# Patient Record
Sex: Male | Born: 1990
Health system: Southern US, Community
[De-identification: ages and names within clinical notes are randomized; demographics above are authoritative.]

## PROBLEM LIST (undated history)

## (undated) DIAGNOSIS — I10 Essential (primary) hypertension: Secondary | ICD-10-CM

## (undated) DIAGNOSIS — E66812 Obesity, class 2: Secondary | ICD-10-CM

## (undated) DIAGNOSIS — F419 Anxiety disorder, unspecified: Secondary | ICD-10-CM

## (undated) DIAGNOSIS — E669 Obesity, unspecified: Secondary | ICD-10-CM

## (undated) DIAGNOSIS — D751 Secondary polycythemia: Secondary | ICD-10-CM

## (undated) DIAGNOSIS — M7918 Myalgia, other site: Secondary | ICD-10-CM

## (undated) HISTORY — DX: Obesity, unspecified: E66.9

## (undated) HISTORY — DX: Secondary polycythemia: D75.1

## (undated) HISTORY — DX: Myalgia, other site: M79.18

## (undated) HISTORY — DX: Anxiety disorder, unspecified: F41.9

## (undated) HISTORY — DX: Obesity, class 2: E66.812

---
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
## 2008-06-13 ENCOUNTER — Emergency Department (HOSPITAL_COMMUNITY): Admission: EM | Admit: 2008-06-13 | Discharge: 2008-06-13 | Payer: Self-pay | Admitting: Emergency Medicine

## 2008-06-21 ENCOUNTER — Emergency Department (HOSPITAL_COMMUNITY): Admission: EM | Admit: 2008-06-21 | Discharge: 2008-06-21 | Payer: Self-pay | Admitting: Emergency Medicine

## 2008-09-23 ENCOUNTER — Emergency Department (HOSPITAL_COMMUNITY): Admission: EM | Admit: 2008-09-23 | Discharge: 2008-09-23 | Payer: Self-pay | Admitting: Emergency Medicine

## 2009-04-23 ENCOUNTER — Emergency Department (HOSPITAL_COMMUNITY): Admission: EM | Admit: 2009-04-23 | Discharge: 2009-04-23 | Payer: Self-pay | Admitting: Emergency Medicine

## 2009-05-03 ENCOUNTER — Emergency Department (HOSPITAL_COMMUNITY): Admission: EM | Admit: 2009-05-03 | Discharge: 2009-05-03 | Payer: Self-pay | Admitting: Emergency Medicine

## 2010-06-08 LAB — DIFFERENTIAL
Basophils Absolute: 0 10*3/uL (ref 0.0–0.1)
Eosinophils Absolute: 0.4 10*3/uL (ref 0.0–0.7)
Lymphs Abs: 1.8 10*3/uL (ref 0.7–4.0)
Monocytes Absolute: 0.7 10*3/uL (ref 0.1–1.0)
Monocytes Relative: 10 % (ref 3–12)
Neutrophils Relative %: 56 % (ref 43–77)

## 2010-06-08 LAB — CBC
HCT: 47 % (ref 39.0–52.0)
Hemoglobin: 15.9 g/dL (ref 13.0–17.0)
MCHC: 33.8 g/dL (ref 30.0–36.0)
MCV: 87.3 fL (ref 78.0–100.0)
RBC: 5.38 MIL/uL (ref 4.22–5.81)

## 2010-06-08 LAB — COMPREHENSIVE METABOLIC PANEL
AST: 22 U/L (ref 0–37)
BUN: 13 mg/dL (ref 6–23)
Calcium: 9.1 mg/dL (ref 8.4–10.5)
Chloride: 100 mEq/L (ref 96–112)
Creatinine, Ser: 0.73 mg/dL (ref 0.4–1.5)
Potassium: 3.9 mEq/L (ref 3.5–5.1)
Total Bilirubin: 0.7 mg/dL (ref 0.3–1.2)

## 2010-09-06 ENCOUNTER — Emergency Department (HOSPITAL_COMMUNITY): Payer: Self-pay

## 2010-09-06 ENCOUNTER — Emergency Department (HOSPITAL_COMMUNITY)
Admission: EM | Admit: 2010-09-06 | Discharge: 2010-09-06 | Disposition: A | Discharge status: Home / Self Care | Payer: Self-pay | Attending: Emergency Medicine | Admitting: Emergency Medicine

## 2010-09-06 DIAGNOSIS — J069 Acute upper respiratory infection, unspecified: Secondary | ICD-10-CM | POA: Insufficient documentation

## 2010-09-06 DIAGNOSIS — R197 Diarrhea, unspecified: Secondary | ICD-10-CM | POA: Insufficient documentation

## 2010-09-06 DIAGNOSIS — J3489 Other specified disorders of nose and nasal sinuses: Secondary | ICD-10-CM | POA: Insufficient documentation

## 2010-11-28 ENCOUNTER — Emergency Department (HOSPITAL_COMMUNITY)
Admission: EM | Admit: 2010-11-28 | Discharge: 2010-11-28 | Disposition: A | Discharge status: Home / Self Care | Payer: Self-pay | Attending: Emergency Medicine | Admitting: Emergency Medicine

## 2010-11-28 ENCOUNTER — Encounter: Payer: Self-pay | Admitting: *Deleted

## 2010-11-28 DIAGNOSIS — T7840XA Allergy, unspecified, initial encounter: Secondary | ICD-10-CM

## 2010-11-28 DIAGNOSIS — R0602 Shortness of breath: Secondary | ICD-10-CM | POA: Insufficient documentation

## 2010-11-28 DIAGNOSIS — R21 Rash and other nonspecific skin eruption: Secondary | ICD-10-CM | POA: Insufficient documentation

## 2010-11-28 HISTORY — DX: Essential (primary) hypertension: I10

## 2010-11-28 MED ORDER — FAMOTIDINE IN NACL 20-0.9 MG/50ML-% IV SOLN
20.0000 mg | Freq: Once | INTRAVENOUS | Status: AC
  Administered 2010-11-28: 20 mg via INTRAVENOUS
  Filled 2010-11-28 (×2): qty 50

## 2010-11-28 MED ORDER — PREDNISONE 20 MG PO TABS: 20.0000 mg | ORAL_TABLET | Freq: Every day | ORAL | Status: AC

## 2010-11-28 MED ORDER — PREDNISONE 20 MG PO TABS
60.0000 mg | ORAL_TABLET | Freq: Once | ORAL | Status: AC
  Administered 2010-11-28: 60 mg via ORAL
  Filled 2010-11-28: qty 3

## 2010-11-28 NOTE — ED Provider Notes (Signed)
History     CSN: [id number] Arrival date & time: 11/28/2010  4:54 PM  Chief Complaint  Patient presents with  . Allergic Reaction   HPI Pt was seen at 1745.  Per pt, c/o gradual onset and mild improvement in persistent "hives all over" that began PTA.  Has been assoc with mild SOB and feeling "itchy all over."  Pt states he "smelled mothballs" before symptoms began.  Pt states he took 2 benadryl at home PTA with "some" improvement in symptoms.  Denies intra-oral edema, no wheezing or stridor, no CP/palpitations, no cough.    Past Medical History  Diagnosis Date  . Hypertension     History reviewed. No pertinent past surgical history.   History  Substance Use Topics  . Smoking status: Never Smoker   . Smokeless tobacco: Current User    Types: Snuff  . Alcohol Use: No   Allergies  Allergen Reactions  . Bee Swelling  . Shrimp (Shellfish Allergy) Hives    Review of Systems ROS: Statement: All systems negative except as marked or noted in the HPI; Constitutional: Negative for fever and chills. ; ; Eyes: Negative for eye pain, redness and discharge. ; ; ENMT: Negative for ear pain, hoarseness, nasal congestion, sinus pressure and sore throat. ; ; Cardiovascular: +mild SOB. Negative for chest pain, palpitations, diaphoresis, and peripheral edema. ; ; Respiratory: Negative for cough, wheezing and stridor. ; ; Gastrointestinal: Negative for nausea, vomiting, diarrhea and abdominal pain, blood in stool, hematemesis, jaundice and rectal bleeding. . ; ; Genitourinary: Negative for dysuria, flank pain and hematuria. ; ; Musculoskeletal: Negative for back pain and neck pain. Negative for swelling and trauma.; ; Skin: +hives.  Negative for abrasions, blisters, bruising and skin lesion.; ; Neuro: Negative for headache, lightheadedness and neck stiffness. Negative for weakness, altered level of consciousness , altered mental status, extremity weakness, paresthesias, involuntary movement, seizure and  syncope.     Physical Exam  BP 134/76  Pulse 75  Temp(Src) 98.3 F (36.8 C) (Oral)  Resp 20  Ht 6' (1.829 m)  Wt 232 lb (105.235 kg)  BMI 31.47 kg/m2  SpO2 100%  Physical Exam 1750: Physical examination:  Nursing notes reviewed; Vital signs and O2 SAT reviewed;  Constitutional: Well developed, Well nourished, Well hydrated, In no acute distress; Head:  Normocephalic, atraumatic; Eyes: EOMI, PERRL, No scleral icterus; ENMT: Mouth and pharynx normal, Mucous membranes moist, no intra-oral edema, no hoarse voice, no drooling, no stridor.; Neck: Supple, Full range of motion, No lymphadenopathy; Cardiovascular: Regular rate and rhythm, No murmur, rub, or gallop; Respiratory: Breath sounds clear & equal bilaterally, No rales, rhonchi, wheezes, Normal respiratory effort/excursion, speaking full sentences with ease; Chest: Nontender, Movement normal; Abdomen: Soft, Nontender, Nondistended, Normal bowel sounds; Extremities: Pulses normal, No tenderness, No edema, No calf edema or asymmetry.; Neuro: AA&Ox3, Major CN grossly intact.  No gross focal motor or sensory deficits in extremities.; Skin: Color normal, Warm, Dry, +diffuse fading hives.    ED Course  Procedures  MDM MDM Reviewed: nursing note and vitals   7:54 PM:  Pt feels better and wants to go home now.  Rash significantly improved after meds, no signs of worsening after several hours of observation in ED.  Dx testing d/w pt and family.  Questions answered.  Verb understanding, agreeable to d/c home with outpt f/u.   Medications given in ED:  predniSONE (DELTASONE) tablet 60 mg (60 mg Oral Given 11/28/10 1805)  famotidine (PEPCID) IVPB 20 mg (0 mg Intravenous  Stopped 11/28/10 1900)       Samuel Jester Allison Quarry, DO 11/29/10 1331

## 2010-11-28 NOTE — ED Notes (Signed)
C/o ? Allergic reaction that started PTA.  Whelps noted to entire body.  Pt states is feeling anxious and states his breathing is "funny".  States feels like is sob and feels like his throat is swelling "a little."  No Respiratory distress noted in triage.  Pt speaking clear, full sentences.  C/o itching.

## 2010-11-28 NOTE — ED Notes (Signed)
Patient is comfortable does not need anything at this time. 

## 2012-06-04 ENCOUNTER — Telehealth: Payer: Self-pay | Admitting: Physician Assistant

## 2012-06-04 MED ORDER — BENAZEPRIL HCL 10 MG PO TABS: 10.0000 mg | ORAL_TABLET | Freq: Every day | ORAL | Status: DC

## 2012-06-04 NOTE — Telephone Encounter (Signed)
Medication refilled per protocol. 

## 2014-08-18 ENCOUNTER — Emergency Department (HOSPITAL_COMMUNITY)
Admission: EM | Admit: 2014-08-18 | Discharge: 2014-08-18 | Disposition: A | Discharge status: Home / Self Care | Payer: 59 | Attending: Emergency Medicine | Admitting: Emergency Medicine

## 2014-08-18 ENCOUNTER — Encounter (HOSPITAL_COMMUNITY): Payer: Self-pay | Admitting: Emergency Medicine

## 2014-08-18 DIAGNOSIS — I10 Essential (primary) hypertension: Secondary | ICD-10-CM | POA: Diagnosis not present

## 2014-08-18 DIAGNOSIS — Z720 Tobacco use: Secondary | ICD-10-CM | POA: Diagnosis not present

## 2014-08-18 DIAGNOSIS — L729 Follicular cyst of the skin and subcutaneous tissue, unspecified: Secondary | ICD-10-CM | POA: Diagnosis not present

## 2014-08-18 DIAGNOSIS — R590 Localized enlarged lymph nodes: Secondary | ICD-10-CM | POA: Diagnosis present

## 2014-08-18 NOTE — ED Provider Notes (Signed)
CSN: [id number]     Arrival date & time 08/18/14  1641 History   First MD Initiated Contact with Patient 08/18/14 1655     Chief Complaint  Patient presents with  . Lymphadenopathy     (Consider location/radiation/quality/duration/timing/severity/associated sxs/prior Treatment) The history is provided by the patient.   24 year old male concerned about an area of tenderness behind his right ear. Has been present since yesterday. No fevers no chills no nausea vomiting no abdominal pain no skin rash. No history of anything similar. Denies any of bumped or tenderness areas anywhere else.  Past Medical History  Diagnosis Date  . Hypertension    History reviewed. No pertinent past surgical history. No family history on file. History  Substance Use Topics  . Smoking status: Current Every Day Smoker -- 0.50 packs/day    Types: Cigarettes  . Smokeless tobacco: Current User    Types: Snuff  . Alcohol Use: Yes     Comment: socially    Review of Systems  Constitutional: Negative for fever and chills.  HENT: Negative for congestion.   Eyes: Negative for redness.  Respiratory: Negative for shortness of breath.   Cardiovascular: Negative for chest pain.  Gastrointestinal: Negative for nausea, vomiting and abdominal pain.  Genitourinary: Negative for dysuria.  Musculoskeletal: Negative for back pain and neck pain.  Skin: Negative for rash.  Neurological: Negative for headaches.  Hematological: Does not bruise/bleed easily.  Psychiatric/Behavioral: Negative for confusion.      Allergies  Bee venom  Home Medications   Prior to Admission medications   Medication Sig Start Date End Date Taking? Authorizing Provider  ibuprofen (ADVIL,MOTRIN) 200 MG tablet Take 200 mg by mouth every 6 (six) hours as needed for headache, mild pain or moderate pain.   Yes Historical Provider, MD  benazepril (LOTENSIN) 10 MG tablet Take 1 tablet (10 mg total) by mouth daily. Patient not taking: Reported  on 08/18/2014 06/04/12   Orlena Sheldon, PA-C   BP 163/97 mmHg  Pulse 78  Resp 20  Ht 6' (1.829 m)  Wt 237 lb (107.502 kg)  BMI 32.14 kg/m2  SpO2 98% Physical Exam  Constitutional: He is oriented to person, place, and time. He appears well-developed and well-nourished. No distress.  HENT:  Head: Normocephalic and atraumatic.  Mouth/Throat: Oropharynx is clear and moist.  5 mm skin mass directly behind the right ear lobe. Possibly a developing skin cyst or small abscess or possibly inflamed lymph node. No other adenopathy. A little bit of redness to the area. No fluctuance.  Eyes: Conjunctivae and EOM are normal. Pupils are equal, round, and reactive to light. Right eye exhibits no discharge. Left eye exhibits no discharge. No scleral icterus.  Neck: Normal range of motion. Neck supple.  Cardiovascular: Normal rate, regular rhythm and normal heart sounds.   No murmur heard. Pulmonary/Chest: Effort normal and breath sounds normal. No respiratory distress.  Abdominal: Soft. Bowel sounds are normal. There is no tenderness.  Musculoskeletal: Normal range of motion.  Lymphadenopathy:    He has no cervical adenopathy.  Neurological: He is alert and oriented to person, place, and time. No cranial nerve deficit. He exhibits normal muscle tone. Coordination normal.  Skin: Skin is warm. No erythema.  Nursing note and vitals reviewed.   ED Course  Procedures (including critical care time) Labs Review Labs Reviewed - No data to display  Imaging Review No results found.   EKG Interpretation None      MDM   Final diagnoses:  Skin cyst    Patient with an area of the tenderness and skin bump measuring about 5 mm behind directly behind the right ear lobe. Could represent just a skin cyst that's inflamed or perhaps a inflamed lymph node. No other adenopathy. No systemic symptoms. Clinically no treatment required at this time. Close observation would be appropriate.    Fredia Sorrow,  MD 08/18/14 [phone]

## 2014-08-18 NOTE — ED Notes (Signed)
Onset yesterday, itching on back of neck, noticed enlarged lymph node behind right ear.

## 2014-08-18 NOTE — Discharge Instructions (Signed)
Area behind the right ear may be a skin cyst or could be a inflamed lymph node. Would recommend marching it carefully. Return for any new or worse symptoms.

## 2015-12-06 ENCOUNTER — Encounter (HOSPITAL_COMMUNITY): Payer: Self-pay | Admitting: Emergency Medicine

## 2015-12-06 ENCOUNTER — Emergency Department (HOSPITAL_COMMUNITY)
Admission: EM | Admit: 2015-12-06 | Discharge: 2015-12-06 | Disposition: A | Discharge status: Home / Self Care | Payer: BC Managed Care – PPO | Attending: Emergency Medicine | Admitting: Emergency Medicine

## 2015-12-06 DIAGNOSIS — R51 Headache: Secondary | ICD-10-CM | POA: Insufficient documentation

## 2015-12-06 DIAGNOSIS — J020 Streptococcal pharyngitis: Secondary | ICD-10-CM | POA: Diagnosis not present

## 2015-12-06 DIAGNOSIS — J029 Acute pharyngitis, unspecified: Secondary | ICD-10-CM | POA: Diagnosis present

## 2015-12-06 DIAGNOSIS — Z23 Encounter for immunization: Secondary | ICD-10-CM | POA: Diagnosis not present

## 2015-12-06 DIAGNOSIS — Z791 Long term (current) use of non-steroidal anti-inflammatories (NSAID): Secondary | ICD-10-CM | POA: Diagnosis not present

## 2015-12-06 DIAGNOSIS — I10 Essential (primary) hypertension: Secondary | ICD-10-CM | POA: Diagnosis not present

## 2015-12-06 DIAGNOSIS — F17210 Nicotine dependence, cigarettes, uncomplicated: Secondary | ICD-10-CM | POA: Insufficient documentation

## 2015-12-06 DIAGNOSIS — R109 Unspecified abdominal pain: Secondary | ICD-10-CM | POA: Diagnosis not present

## 2015-12-06 LAB — URINALYSIS, ROUTINE W REFLEX MICROSCOPIC
Bilirubin Urine: NEGATIVE
GLUCOSE, UA: NEGATIVE mg/dL
HGB URINE DIPSTICK: NEGATIVE
Ketones, ur: NEGATIVE mg/dL
Leukocytes, UA: NEGATIVE
NITRITE: NEGATIVE
PH: 7 (ref 5.0–8.0)
Protein, ur: NEGATIVE mg/dL
SPECIFIC GRAVITY, URINE: 1.01 (ref 1.005–1.030)

## 2015-12-06 LAB — RAPID STREP SCREEN (MED CTR MEBANE ONLY): Streptococcus, Group A Screen (Direct): POSITIVE — AB

## 2015-12-06 MED ORDER — PENICILLIN G BENZATHINE 1200000 UNIT/2ML IM SUSP
1.2000 10*6.[IU] | Freq: Once | INTRAMUSCULAR | Status: AC
  Administered 2015-12-06: 1.2 10*6.[IU] via INTRAMUSCULAR
  Filled 2015-12-06: qty 2

## 2015-12-06 MED ORDER — ACETAMINOPHEN 500 MG PO TABS
1000.0000 mg | ORAL_TABLET | Freq: Once | ORAL | Status: AC
  Administered 2015-12-06: 1000 mg via ORAL
  Filled 2015-12-06: qty 2

## 2015-12-06 MED ORDER — TETANUS-DIPHTH-ACELL PERTUSSIS 5-2.5-18.5 LF-MCG/0.5 IM SUSP
0.5000 mL | Freq: Once | INTRAMUSCULAR | Status: AC
  Administered 2015-12-06: 0.5 mL via INTRAMUSCULAR
  Filled 2015-12-06: qty 0.5

## 2015-12-06 MED ORDER — IBUPROFEN 800 MG PO TABS
800.0000 mg | ORAL_TABLET | Freq: Once | ORAL | Status: AC
  Administered 2015-12-06: 800 mg via ORAL
  Filled 2015-12-06: qty 1

## 2015-12-06 NOTE — ED Triage Notes (Signed)
Pt c/o sore throat and body aches that began this morning.

## 2015-12-06 NOTE — Discharge Instructions (Signed)
Rest, make sure are drinking plenty of fluids.  Take motrin or tylenol for throat pain and fever reduction.  You should start feeling better over the next several days.

## 2015-12-06 NOTE — ED Notes (Signed)
Pt seen and evaluated by EDPa for initial assessment. 

## 2015-12-06 NOTE — ED Provider Notes (Signed)
[facility] DEPT Provider Note   CSN: [id number] Arrival date & time: 12/06/15  1100  By signing my name below, I, Soijett Blue, attest that this documentation has been prepared under the direction and in the presence of Evalee Jefferson, PA-C Electronically Signed: Soijett Blue, ED Scribe. 12/06/15. 11:40 AM.    History   Chief Complaint Chief Complaint  Patient presents with  . Sore Throat    HPI  Anthony Sharp is a 25 y.o. male with a PMHx of HTN, who presents to the Emergency Department complaining of sore throat onset this morning PTA. Pt denies sick contacts. Pt notes that he works for [employer]. He states that he is having associated symptoms of generalized body aches, fever, chills, HA,  nausea, upper abdominal soreness worse on RUQ, and resolved diarrhea x yesterday. He states that he has not tried any medications for the relief for his symptoms. He denies cough, nasal congestion, ear pain, dental pain, vomiting, and any other symptoms.  Pt does have a PCP. Pt states that he hasn't taken his HTN medications and his last dose was years ago. Denies abdominal surgeries.      The history is provided by the patient. No language interpreter was used.    Past Medical History:  Diagnosis Date  . Hypertension     There are no active problems to display for this patient.   History reviewed. No pertinent surgical history.     Home Medications    Prior to Admission medications   Medication Sig Start Date End Date Taking? Authorizing Provider  acetaminophen (TYLENOL) 500 MG tablet Take 500 mg by mouth every 6 (six) hours as needed for mild pain or moderate pain.    Historical Provider, MD  ibuprofen (ADVIL,MOTRIN) 200 MG tablet Take 200 mg by mouth every 6 (six) hours as needed for headache, mild pain or moderate pain.    Historical Provider, MD    Family History No family history on file.  Social History Social History  Substance Use Topics  . Smoking status: Current  Every Day Smoker    Packs/day: 0.50    Types: Cigarettes  . Smokeless tobacco: Current User    Types: Snuff  . Alcohol use Yes     Comment: socially     Allergies   Bee venom   Review of Systems Review of Systems  Constitutional: Positive for chills and fever.  HENT: Positive for sore throat. Negative for congestion and ear pain.   Respiratory: Positive for cough. Negative for shortness of breath.   Gastrointestinal: Positive for abdominal pain (epigastric), diarrhea (resolved) and nausea. Negative for vomiting.  Musculoskeletal: Positive for myalgias.  Neurological: Positive for headaches.     Physical Exam Updated Vital Signs BP 149/87 (BP Location: Right Arm)   Pulse 103   Temp 101.5 F (38.6 C) (Oral)   Resp 16   Ht 6' (1.829 m)   Wt 108.9 kg   SpO2 99%   BMI 32.55 kg/m   Physical Exam  Constitutional: He appears well-developed and well-nourished.  HENT:  Head: Normocephalic and atraumatic.  Right Ear: Tympanic membrane, external ear and ear canal normal.  Left Ear: Tympanic membrane, external ear and ear canal normal.  Nose: Right sinus exhibits no maxillary sinus tenderness and no frontal sinus tenderness. Left sinus exhibits no maxillary sinus tenderness and no frontal sinus tenderness.  Mouth/Throat: Uvula is midline and mucous membranes are normal. Posterior oropharyngeal erythema present. No oropharyngeal exudate, posterior oropharyngeal edema or tonsillar abscesses.  Tonsils are 1+ on the right. Tonsils are 1+ on the left.  Petechiae to soft palate.   Eyes: Conjunctivae are normal.  Neck: Normal range of motion.  Cardiovascular: Normal rate, regular rhythm, normal heart sounds and intact distal pulses.  Exam reveals no gallop and no friction rub.   No murmur heard. Pulmonary/Chest: Effort normal and breath sounds normal. No respiratory distress. He has no wheezes. He has no rales.  Abdominal: Soft. Bowel sounds are normal. There is no hepatosplenomegaly.  There is tenderness in the right upper quadrant and left upper quadrant. There is no rigidity, no rebound, no guarding and negative Murphy's sign.  Musculoskeletal: Normal range of motion.  Lymphadenopathy:       Head (right side): No tonsillar adenopathy present.       Head (left side): Tonsillar adenopathy present.  Neurological: He is alert.  Skin: Skin is warm and dry.  Psychiatric: He has a normal mood and affect.  Nursing note and vitals reviewed.    ED Treatments / Results  DIAGNOSTIC STUDIES: Oxygen Saturation is 97% on RA, nl by my interpretation.    COORDINATION OF CARE: 11:34 AM Discussed treatment plan with pt at bedside which includes rapid strep screen, bicillin injection, update tetanus vaccination, and pt agreed to plan.  12:59 PM- Pt reassessed and pt mother requests that the pt obtain a tetanus vaccination, due to the pt being overdue for his tetanus vaccination.    Labs (all labs ordered are listed, but only abnormal results are displayed) Labs Reviewed  RAPID STREP SCREEN (NOT AT [facility]) - Abnormal; Notable for the following:       Result Value   Streptococcus, Group A Screen (Direct) POSITIVE (*)    All other components within normal limits  URINALYSIS, ROUTINE W REFLEX MICROSCOPIC (NOT AT [facility])    Procedures Procedures (including critical care time)  Medications Ordered in ED Medications  ibuprofen (ADVIL,MOTRIN) tablet 800 mg (800 mg Oral Given 12/06/15 1153)  penicillin g benzathine (BICILLIN LA) 1200000 UNIT/2ML injection 1.2 Million Units (1.2 Million Units Intramuscular Given 12/06/15 1310)  Tdap (BOOSTRIX) injection 0.5 mL (0.5 mLs Intramuscular Given 12/06/15 1312)  acetaminophen (TYLENOL) tablet 1,000 mg (1,000 mg Oral Given 12/06/15 1319)     Initial Impression / Assessment and Plan / ED Course  I have reviewed the triage vital signs and the nursing notes.  Pertinent labs that were available during my care of the patient were reviewed by me and  considered in my medical decision making (see chart for details).  Clinical Course    Bicillin LA given. Pt advised rest, motrin or tylenol. Prn f/u for any worsening sx.  C/o abd pain, but exam is benign, suspect this is directly related to strep infection.  No HSM.  No guarding on exam.  Prn f/u anticipated.  Final Clinical Impressions(s) / ED Diagnoses   Final diagnoses:  Strep throat    New Prescriptions Discharge Medication List as of 12/06/2015  1:01 PM      I personally performed the services described in this documentation, which was scribed in my presence. The recorded information has been reviewed and is accurate.     Evalee Jefferson, PA-C 12/08/15 1650    Julianne Rice, MD 12/13/15 [phone]

## 2015-12-07 ENCOUNTER — Emergency Department (HOSPITAL_COMMUNITY)
Admission: EM | Admit: 2015-12-07 | Discharge: 2015-12-07 | Disposition: A | Discharge status: Home / Self Care | Payer: BC Managed Care – PPO | Attending: Emergency Medicine | Admitting: Emergency Medicine

## 2015-12-07 ENCOUNTER — Emergency Department (HOSPITAL_COMMUNITY): Payer: BC Managed Care – PPO

## 2015-12-07 ENCOUNTER — Encounter (HOSPITAL_COMMUNITY): Payer: Self-pay | Admitting: Emergency Medicine

## 2015-12-07 DIAGNOSIS — Z79899 Other long term (current) drug therapy: Secondary | ICD-10-CM | POA: Insufficient documentation

## 2015-12-07 DIAGNOSIS — Z791 Long term (current) use of non-steroidal anti-inflammatories (NSAID): Secondary | ICD-10-CM | POA: Insufficient documentation

## 2015-12-07 DIAGNOSIS — F17210 Nicotine dependence, cigarettes, uncomplicated: Secondary | ICD-10-CM | POA: Diagnosis not present

## 2015-12-07 DIAGNOSIS — I10 Essential (primary) hypertension: Secondary | ICD-10-CM | POA: Diagnosis not present

## 2015-12-07 DIAGNOSIS — J020 Streptococcal pharyngitis: Secondary | ICD-10-CM | POA: Diagnosis present

## 2015-12-07 LAB — CBC
HEMATOCRIT: 50.6 % (ref 39.0–52.0)
HEMOGLOBIN: 18 g/dL — AB (ref 13.0–17.0)
MCH: 31.5 pg (ref 26.0–34.0)
MCHC: 35.6 g/dL (ref 30.0–36.0)
MCV: 88.6 fL (ref 78.0–100.0)
PLATELETS: 157 10*3/uL (ref 150–400)
RBC: 5.71 MIL/uL (ref 4.22–5.81)
RDW: 12.7 % (ref 11.5–15.5)
WBC: 16.4 10*3/uL — ABNORMAL HIGH (ref 4.0–10.5)

## 2015-12-07 LAB — BASIC METABOLIC PANEL
Anion gap: 8 (ref 5–15)
BUN: 11 mg/dL (ref 6–20)
CO2: 24 mmol/L (ref 22–32)
CREATININE: 1.02 mg/dL (ref 0.61–1.24)
Calcium: 8.9 mg/dL (ref 8.9–10.3)
Chloride: 101 mmol/L (ref 101–111)
GFR calc Af Amer: >60 mL/min (ref >60)
GLUCOSE: 107 mg/dL — AB (ref 65–99)
Potassium: 3.8 mmol/L (ref 3.5–5.1)
SODIUM: 133 mmol/L — AB (ref 135–145)

## 2015-12-07 MED ORDER — DEXAMETHASONE SODIUM PHOSPHATE 10 MG/ML IJ SOLN
10.0000 mg | Freq: Once | INTRAMUSCULAR | Status: AC
  Administered 2015-12-07: 10 mg via INTRAMUSCULAR
  Filled 2015-12-07: qty 1

## 2015-12-07 NOTE — Discharge Instructions (Signed)
You should improve within the next 48 hours. I would continue taking ibuprofen and Tylenol for fever control and to help with possible pain. If you don't improve in the next 48 hours he can follow-up with an ENT specialist.

## 2015-12-07 NOTE — ED Provider Notes (Signed)
[facility] DEPT Provider Note   CSN: [id number] Arrival date & time: 12/07/15  1933     History   Chief Complaint No chief complaint on file.   HPI Anthony Sharp is a 25 y.o. male pmhx significant for HTN here for fever. Patient was seen yesterday for strep throat and treated with Bicillin IM. She returning because he continues to have throat swelling and fever. States he had a fever of 102 today, has been taking ibuprofen and Tylenol for this. Patient denies any shortness of breath or trouble breathing. Positive for fever. Negative for nausea or vomiting. Positive for sore throat. Positive for slight cough.   HPI  Past Medical History:  Diagnosis Date  . Hypertension     There are no active problems to display for this patient.   History reviewed. No pertinent surgical history.     Home Medications    Prior to Admission medications   Medication Sig Start Date End Date Taking? Authorizing Provider  acetaminophen (TYLENOL) 500 MG tablet Take 500 mg by mouth every 6 (six) hours as needed for mild pain or moderate pain.   Yes Historical Provider, MD  ibuprofen (ADVIL,MOTRIN) 200 MG tablet Take 200 mg by mouth every 6 (six) hours as needed for headache, mild pain or moderate pain.   Yes Historical Provider, MD    Family History No family history on file.  Social History Social History  Substance Use Topics  . Smoking status: Current Every Day Smoker    Packs/day: 0.50    Types: Cigarettes  . Smokeless tobacco: Current User    Types: Snuff  . Alcohol use Yes     Comment: socially     Allergies   Bee venom   Review of Systems Review of Systems  Constitutional: Positive for fever.  HENT: Positive for sore throat. Negative for voice change.   Eyes: Negative for discharge and itching.  Respiratory: Positive for cough. Negative for shortness of breath.   Cardiovascular: Negative for chest pain.  Gastrointestinal: Negative for abdominal distention and  abdominal pain.  Genitourinary: Negative for dysuria and flank pain.  Musculoskeletal: Negative for back pain and neck pain.  Skin: Negative for color change and rash.  Neurological: Negative for dizziness and headaches.  Psychiatric/Behavioral: Negative for agitation and dysphoric mood.     Physical Exam Updated Vital Signs BP 145/94 (BP Location: Right Arm)   Pulse 106   Temp 100.4 F (38 C) (Oral)   Resp 18   Ht 6' (1.829 m)   Wt 109.3 kg   SpO2 97%   BMI 32.69 kg/m   Physical Exam  Constitutional: He is oriented to person, place, and time. He appears well-developed and well-nourished.  HENT:  Head: Normocephalic and atraumatic.  Mouth/Throat: Posterior oropharyngeal edema present. No tonsillar abscesses. Tonsils are 2+ on the right. Tonsils are 2+ on the left. Tonsillar exudate.  Eyes: EOM are normal. Pupils are equal, round, and reactive to light.  Neck: Normal range of motion. Neck supple.  Cardiovascular: Normal rate, regular rhythm, normal heart sounds and intact distal pulses.   Pulmonary/Chest: Effort normal and breath sounds normal.  Abdominal: Soft. Bowel sounds are normal.  Musculoskeletal: Normal range of motion.  Neurological: He is alert and oriented to person, place, and time.  Skin: Skin is warm and dry.     ED Treatments / Results  Labs (all labs ordered are listed, but only abnormal results are displayed) Labs Reviewed  CBC - Abnormal; Notable for the  following:       Result Value   WBC 16.4 (*)    Hemoglobin 18.0 (*)    All other components within normal limits  BASIC METABOLIC PANEL - Abnormal; Notable for the following:    Sodium 133 (*)    Glucose, Bld 107 (*)    All other components within normal limits    EKG  EKG Interpretation None       Radiology Dg Chest 2 View  Result Date: 12/07/2015 CLINICAL DATA:  Fever and productive cough for 2 days. History of hypertension. Smoker. EXAM: CHEST  2 VIEW COMPARISON:  09/06/2010 FINDINGS:  The heart size and mediastinal contours are within normal limits. Both lungs are clear. The visualized skeletal structures are unremarkable. IMPRESSION: No active cardiopulmonary disease. Electronically Signed   By: Lucienne Capers M.D.   On: 12/07/2015 22:46    Procedures Procedures (including critical care time)  Medications Ordered in ED Medications  dexamethasone (DECADRON) injection 10 mg (not administered)     Initial Impression / Assessment and Plan / ED Course  I have reviewed the triage vital signs and the nursing notes.  Pertinent labs & imaging results that were available during my care of the patient were reviewed by me and considered in my medical decision making (see chart for details).  Clinical Course    Patient presenting for reevaluation of strep throat. Recieved treatment last night for strep throat with IM penicillin, continues to have fever and throat pain. No signs of peritonsillar abscess on exam. Concern for possible pneumonia ruled out with chest x-ray. Provided patient with a single dose of Decadron to help with throat swelling. Indicated if he continued to have symptoms could follow up with ENT. Counsled patient that it could take up to 48 hours for patient to start to see symptom relief.  Final Clinical Impressions(s) / ED Diagnoses   Final diagnoses:  None    New Prescriptions New Prescriptions   No medications on file     Earlean Fidalgo Cletis Media, MD 12/08/15 [place], MD 12/10/15 1426

## 2015-12-07 NOTE — ED Triage Notes (Signed)
Pt here for follow up, seen here yesterday treated for strep, pt states fever is worse, has been taking tylenol and motrin for fever. Last took at 6pm. Pt states he feels worse, throat feels swollen

## 2015-12-07 NOTE — ED Notes (Signed)
Pt alert & oriented x4, stable gait. Patient given discharge instructions, paperwork & prescription(s). Patient  instructed to stop at the registration desk to finish any additional paperwork. Patient verbalized understanding. Pt left department w/ no further questions. 

## 2016-03-20 HISTORY — PX: BACK SURGERY: SHX140

## 2016-08-01 ENCOUNTER — Emergency Department (HOSPITAL_COMMUNITY)
Admission: EM | Admit: 2016-08-01 | Discharge: 2016-08-02 | Disposition: A | Discharge status: Home / Self Care | Payer: BC Managed Care – PPO | Attending: Emergency Medicine | Admitting: Emergency Medicine

## 2016-08-01 ENCOUNTER — Encounter (HOSPITAL_COMMUNITY): Payer: Self-pay | Admitting: Emergency Medicine

## 2016-08-01 ENCOUNTER — Other Ambulatory Visit: Payer: Self-pay

## 2016-08-01 DIAGNOSIS — I10 Essential (primary) hypertension: Secondary | ICD-10-CM | POA: Diagnosis not present

## 2016-08-01 DIAGNOSIS — R002 Palpitations: Secondary | ICD-10-CM | POA: Insufficient documentation

## 2016-08-01 DIAGNOSIS — F17210 Nicotine dependence, cigarettes, uncomplicated: Secondary | ICD-10-CM | POA: Insufficient documentation

## 2016-08-01 NOTE — ED Triage Notes (Signed)
Tonight while resting pt began having palpitations, sweating, and feeling of tightness in his neck. States he took a deep breath and the palpitations went away. Palpitations are intermittent.

## 2016-08-02 ENCOUNTER — Other Ambulatory Visit: Payer: Self-pay

## 2016-08-02 LAB — I-STAT CHEM 8, ED
BUN: 23 mg/dL — AB (ref 6–20)
CALCIUM ION: 1.11 mmol/L — AB (ref 1.15–1.40)
Chloride: 103 mmol/L (ref 101–111)
Creatinine, Ser: 1 mg/dL (ref 0.61–1.24)
Glucose, Bld: 150 mg/dL — ABNORMAL HIGH (ref 65–99)
HCT: 45 % (ref 39.0–52.0)
HEMOGLOBIN: 15.3 g/dL (ref 13.0–17.0)
Potassium: 3.4 mmol/L — ABNORMAL LOW (ref 3.5–5.1)
SODIUM: 140 mmol/L (ref 135–145)
TCO2: 25 mmol/L (ref 0–100)

## 2016-08-02 NOTE — ED Provider Notes (Signed)
[facility] DEPT Provider Note   CSN: [id number] Arrival date & time: 08/01/16  2257  By signing my name below, I, Marcello Moores, attest that this documentation has been prepared under the direction and in the presence of Ripley Fraise, MD. Electronically Signed: Marcello Moores, ED Scribe. 08/02/16. 1:13 AM.   History   Chief Complaint Chief Complaint  Patient presents with  . Palpitations    The history is provided by the patient. No language interpreter was used.  Palpitations   This is a new problem. The current episode started 3 to 5 hours ago. The problem occurs rarely. The problem has been resolved. The problem is associated with an unknown factor. Associated symptoms include chest pressure. Pertinent negatives include no fever, no chest pain, no abdominal pain, no vomiting and no shortness of breath.    HPI Comments: Anthony Sharp is a 26 y.o. male, with a PMHx of  HTN, presents to the Emergency Department complaining of a resolved sudden gradual onset of a "racing heart" that began around 10:15pm yesterday evening. He recalls that he was sitting at home when his face felt "weird" and became hot. He attests that his face still feels hot, and and his neck feels "tight." Pt has associated chest pressure during the the episodes He also reports that he has sciatica. Pt denies fevers, vomiting, chest pain, SOB, abdominal pain, and LOC. He denies smoking, participating in recrational drugs, and habitually taking OVC medications. He denies drinking excessive caffeine yesterday.  Past Medical History:  Diagnosis Date  . Hypertension     There are no active problems to display for this patient.   History reviewed. No pertinent surgical history.     Home Medications    Prior to Admission medications   Medication Sig Start Date End Date Taking? Authorizing Provider  acetaminophen (TYLENOL) 500 MG tablet Take 500 mg by mouth every 6 (six) hours as needed for mild  pain or moderate pain.    [provider]  ibuprofen (ADVIL,MOTRIN) 200 MG tablet Take 200 mg by mouth every 6 (six) hours as needed for headache, mild pain or moderate pain.    [provider]    Family History History reviewed. No pertinent family history.  Social History Social History  Substance Use Topics  . Smoking status: Current Every Day Smoker    Packs/day: 1.00    Types: Cigarettes  . Smokeless tobacco: Current User    Types: Snuff  . Alcohol use Yes     Comment: socially     Allergies   Bee venom   Review of Systems Review of Systems  Constitutional: Negative for fever.  Respiratory: Negative for shortness of breath.   Cardiovascular: Positive for palpitations. Negative for chest pain.  Gastrointestinal: Negative for abdominal pain and vomiting.  Neurological: Negative for syncope.  All other systems reviewed and are negative.    Physical Exam Updated Vital Signs BP (!) 150/101 (BP Location: Right Arm)   Pulse 84   Temp 98.3 F (36.8 C) (Oral)   Resp (!) 21   Ht 5\' 11"  (1.803 m)   Wt 245 lb (111.1 kg)   SpO2 100%   BMI 34.17 kg/m   Physical Exam CONSTITUTIONAL: Well developed/well nourished HEAD: Normocephalic/atraumatic EYES: EOMI/PERRL ENMT: Mucous membranes moist NECK: supple no meningeal signs SPINE/BACK:entire spine nontender CV: S1/S2 noted, no murmurs/rubs/gallops noted LUNGS: Lungs are clear to auscultation bilaterally, no apparent distress ABDOMEN: soft, nontender, no rebound or guarding, bowel sounds noted throughout abdomen GU:no  cva tenderness NEURO: Pt is awake/alert/appropriate, moves all extremitiesx4.  No facial droop.   EXTREMITIES: pulses normal/equal, full ROM, no calf tenderness. SKIN: warm, color normal PSYCH: no abnormalities of mood noted, alert and oriented to situation   ED Treatments / Results   DIAGNOSTIC STUDIES: Oxygen Saturation is 100% on RA, normal by my interpretation.   COORDINATION  OF CARE: 12:20 AM-Discussed next steps with pt. Pt verbalized understanding and is agreeable with the plan.   Labs (all labs ordered are listed, but only abnormal results are displayed) Labs Reviewed  I-STAT CHEM 8, ED - Abnormal; Notable for the following:       Result Value   Potassium 3.4 (*)    BUN 23 (*)    Glucose, Bld 150 (*)    Calcium, Ion 1.11 (*)    All other components within normal limits    EKG  EKG Interpretation  Date/Time:  Wednesday Aug 02 2016 00:36:18 EDT Ventricular Rate:  90 PR Interval:    QRS Duration: 115 QT Interval:  364 QTC Calculation: 446 R Axis:   58 Text Interpretation:  Sinus rhythm Incomplete right bundle branch block No significant change since last tracing Confirmed by Isla Pence [phone]) on 08/04/2016 4:46:01 AM       Radiology No results found.  Procedures Procedures (including critical care time)  Medications Ordered in ED Medications - No data to display   Initial Impression / Assessment and Plan / ED Course  I have reviewed the triage vital signs and the nursing notes.  Pertinent labs  results that were available during my care of the patient were reviewed by me and considered in my medical decision making (see chart for details).     Pt well appearing, labs reassuring, stable for d/c home  Final Clinical Impressions(s) / ED Diagnoses   Final diagnoses:  Palpitations    New Prescriptions Discharge Medication List as of 08/02/2016  2:22 AM     I personally performed the services described in this documentation, which was scribed in my presence. The recorded information has been reviewed and is accurate.         Ripley Fraise, MD 08/15/16 [id number]

## 2016-08-02 NOTE — ED Provider Notes (Signed)
ED ECG REPORT   Date: 08/02/2016 0036  Rate: 90  Rhythm: normal sinus rhythm  QRS Axis: normal  Intervals: normal  ST/T Wave abnormalities: normal  Conduction Disutrbances:nonspecific intraventricular conduction delay  Narrative Interpretation:   Old EKG Reviewed: none available  I have personally reviewed the EKG tracing and agree with the computerized printout as noted.    Ripley Fraise, MD 08/02/16 [phone]

## 2016-09-13 ENCOUNTER — Ambulatory Visit (INDEPENDENT_AMBULATORY_CARE_PROVIDER_SITE_OTHER): Payer: BC Managed Care – PPO | Admitting: Physician Assistant

## 2016-09-13 ENCOUNTER — Encounter: Payer: Self-pay | Admitting: Physician Assistant

## 2016-09-13 VITALS — BP 144/96 | HR 84 | Temp 97.9°F | Resp 18 | Ht 71.0 in | Wt 246.8 lb

## 2016-09-13 DIAGNOSIS — Z0000 Encounter for general adult medical examination without abnormal findings: Secondary | ICD-10-CM | POA: Diagnosis not present

## 2016-09-13 DIAGNOSIS — F17200 Nicotine dependence, unspecified, uncomplicated: Secondary | ICD-10-CM | POA: Diagnosis not present

## 2016-09-13 DIAGNOSIS — I10 Essential (primary) hypertension: Secondary | ICD-10-CM | POA: Diagnosis not present

## 2016-09-13 DIAGNOSIS — Z01818 Encounter for other preprocedural examination: Secondary | ICD-10-CM

## 2016-09-13 MED ORDER — AMLODIPINE BESYLATE 2.5 MG PO TABS: 2.5000 mg | ORAL_TABLET | Freq: Every day | ORAL | 3 refills | Status: DC

## 2016-09-13 NOTE — Progress Notes (Signed)
Patient ID: Anthony Sharp MRN: [id number], DOB: [birth year] 26 y.o. Date of Encounter: 09/13/2016, 3:47 PM    Chief Complaint: New Patient/ Establish Care, PreOp Evaluation  HPI: 26 y.o. y/o male here for above.   He has paper from [facility] that needs to be faxed in to them for preop surgery clearance.  He states that he is scheduled for surgery this upcoming Friday 09/15/16 at 12:30 with Dr. Rolena Infante.  Reviewed that his blood pressure is reading high today and reviewed that hypertension is documented on his history. He states that he "has been on countless blood pressure medicines" in the past-- back when he was around 26 years old.  Says that he "didn't feel right " when he was on the medicine so medicine ended up being discontinued and switched.  States that his father has high blood pressure and his brother has high blood pressure. Brother is only 6 years old but patient states that his brother is obese.   However he is agreeable to try blood pressure medication again at this point. Says that that was all about 10 years ago and medication may affect him differently now.  He does smoke 1 pack per day. Says that last Monday he got up on Monday morning and told himself  "I'm quitting, I'm done with this" and says that he smoked none all week. However on Sunday he started back smoking because he started getting nervous about the surgery. Says after his surgery he is going to quit.  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  States that he had no specific known injury that caused the problems to his back. Says that he just developed pain in his back and his right leg. Says that he gets very little sleep because of the pain and only sleeps about 1 hour because of the pain.  No other specific concerns to address today.  Review of Systems: Consitutional: No fever,  chills, fatigue, night sweats, lymphadenopathy, or weight changes. Eyes: No visual changes, eye redness, or discharge. ENT/Mouth: Ears: No otalgia, tinnitus, hearing loss, discharge. Nose: No congestion, rhinorrhea, sinus pain, or epistaxis. Throat: No sore throat, post nasal drip, or teeth pain. Cardiovascular: No CP, palpitations, diaphoresis, DOE, edema, orthopnea, PND. Respiratory: No cough, hemoptysis, SOB, or wheezing. Gastrointestinal: No anorexia, dysphagia, reflux, pain, nausea, vomiting, hematemesis, diarrhea, constipation, BRBPR, or melena. Genitourinary: No dysuria, frequency, urgency, hematuria, incontinence, nocturia, decreased urinary stream, discharge, impotence, or testicular pain/masses. Musculoskeletal: ------------See HPI-------------------------- Skin: No rash, erythema, lesion changes, pain, warmth, jaundice, or pruritis. Neurological: No headache, dizziness, syncope, seizures, tremors, memory loss, coordination problems, or paresthesias. Psychological: No anxiety, depression, hallucinations, SI/HI. Endocrine: No fatigue, polydipsia, polyphagia, polyuria, or known diabetes. All other systems were reviewed and are otherwise negative.  Past Medical History:  Diagnosis Date  . Hypertension      History reviewed. No pertinent surgical history.  Home Meds:  Outpatient Medications Prior to Visit  Medication Sig Dispense Refill  . acetaminophen (TYLENOL) 500 MG tablet Take 500 mg by mouth every 6 (six) hours as needed for mild pain or moderate pain.    Marland Kitchen ibuprofen (ADVIL,MOTRIN) 200 MG tablet Take 200 mg by mouth every 6 (six) hours as needed for headache, mild pain or moderate pain.     No facility-administered medications prior to visit.     Allergies:  Allergies  Allergen Reactions  . Bee Venom Swelling    Social History   Social History  . Marital status: Single    Spouse name: N/A  . Number of children: N/A  . Years of education: N/A   Occupational  History  . Not on file.   Social History Main Topics  . Smoking status: Current Every Day Smoker    Packs/day: 1.00    Types: Cigarettes  . Smokeless tobacco: Current User    Types: Snuff  . Alcohol use Yes     Comment: socially  . Drug use: No  . Sexual activity: Not on file   Other Topics Concern  . Not on file   Social History Narrative  . No narrative on file    Family History  Problem Relation Age of Onset  . Hypertension Father   . Hypertension Brother     Physical Exam: Blood pressure (!) 144/96, pulse 84, temperature 97.9 F (36.6 C), temperature source Oral, resp. rate 18, height 5\' 11"  (1.803 m), weight 246 lb 12.8 oz (111.9 kg), SpO2 98 %.  General: Well developed, well nourished WM. Lots of tattoos. Appears in no acute distress. HEENT: Normocephalic, atraumatic. Conjunctiva pink, sclera non-icteric. Pupils 2 mm constricting to 1 mm, round, regular, and equally reactive to light and accomodation. EOMI. Internal auditory canal clear. TMs with good cone of light and without pathology. Nasal mucosa pink. Nares are without discharge. No sinus tenderness. Oral mucosa pink. Pharynx without exudate.   Neck: Supple. Trachea midline. No thyromegaly. Full ROM. No lymphadenopathy. Lungs: Clear to auscultation bilaterally without wheezes, rales, or rhonchi. Breathing is of normal effort and unlabored. Cardiovascular: RRR with S1 S2. No murmurs, rubs, or gallops. Distal pulses 2+ symmetrically. No carotid or abdominal bruits. Abdomen: Soft, non-tender, non-distended with normoactive bowel sounds. No hepatosplenomegaly or masses. No rebound/guarding. No CVA tenderness. No hernias. Musculoskeletal: Full range of motion and 5/5 strength throughout.  Skin: Warm and moist without erythema, ecchymosis, wounds, or rash. Neuro: A+Ox3. CN II-XII grossly intact. Moves all extremities spontaneously. Full sensation throughout. Normal gait. Psych:  Responds to questions appropriately with a  normal affect.   Assessment/Plan:  26 y.o. y/o white male here for CPE  -1. Encounter for medical examination to establish care  2. Preop examination Will obtain labs. If labs normal, then will fax Surgical Clearance form back to [facility]. - CBC with Differential/Platelet - COMPLETE METABOLIC PANEL WITH GFR - TSH  3. Encounter for preventive health examination  A. Screening Labs: He is not fasting. Therefore will not check lipid panel but will go ahead and check other screening labs. - CBC with Differential/Platelet - COMPLETE METABOLIC PANEL WITH GFR - TSH  B. Screening For Prostate Cancer: N/A at this age, no family history  C. Screening For Colorectal Cancer:  N/A at this age, no family history   D. Immunizations: Flu-----------------N/A Tetanus-------- he received T dap 12/06/2015 Pneumococcal------- given that he does smoke he will need to get Pneumovax 23 if he continues to smoke but I do think he will truly quit smoking after his surgery.    4. Essential hypertension Given that he has been on multiple different medications in the past and had adverse effects will start with extremely low dose of medication.  If blood pressure not controlled with this he can increase to taking 2 of these at a time for 5 mg dose. Will use Norvasc since this will not require lab work monitoring. Will have him schedule  follow-up office visit in 3 months----that will give him time to recuperate from his surgery and then come in for follow-up. - amLODipine (NORVASC) 2.5 MG tablet; Take 1 tablet (2.5 mg total) by mouth daily.  Dispense: 30 tablet; Refill: 3  5. Smoker He plans to quit smoking after his surgery. Will follow-up this at his follow-up visit in 3 months.    Signed:   [address] [place], [place]  09/13/2016 3:47 PM

## 2016-09-14 LAB — CBC WITH DIFFERENTIAL/PLATELET
BASOS ABS: 0 {cells}/uL (ref 0–200)
BASOS PCT: 0 %
EOS ABS: 637 {cells}/uL — AB (ref 15–500)
Eosinophils Relative: 7 %
HEMATOCRIT: 50.9 % — AB (ref 38.5–50.0)
Hemoglobin: 17.5 g/dL — ABNORMAL HIGH (ref 13.0–17.0)
LYMPHS PCT: 25 %
Lymphs Abs: 2275 cells/uL (ref 850–3900)
MCH: 31.3 pg (ref 27.0–33.0)
MCHC: 34.4 g/dL (ref 32.0–36.0)
MCV: 90.9 fL (ref 80.0–100.0)
MONO ABS: 1092 {cells}/uL — AB (ref 200–950)
MPV: 10.3 fL (ref 7.5–12.5)
Monocytes Relative: 12 %
Neutro Abs: 5096 cells/uL (ref 1500–7800)
Neutrophils Relative %: 56 %
Platelets: 206 10*3/uL (ref 140–400)
RBC: 5.6 MIL/uL (ref 4.20–5.80)
RDW: 13.1 % (ref 11.0–15.0)
WBC: 9.1 10*3/uL (ref 3.8–10.8)

## 2016-09-14 LAB — COMPLETE METABOLIC PANEL WITH GFR
ALT: 17 U/L (ref 9–46)
AST: 17 U/L (ref 10–40)
Albumin: 4.4 g/dL (ref 3.6–5.1)
Alkaline Phosphatase: 52 U/L (ref 40–115)
BILIRUBIN TOTAL: 0.7 mg/dL (ref 0.2–1.2)
BUN: 13 mg/dL (ref 7–25)
CALCIUM: 9.4 mg/dL (ref 8.6–10.3)
CHLORIDE: 102 mmol/L (ref 98–110)
CO2: 26 mmol/L (ref 20–31)
CREATININE: 1.2 mg/dL (ref 0.60–1.35)
GFR, Est Non African American: 82 mL/min (ref >=60)
Glucose, Bld: 76 mg/dL (ref 70–99)
Potassium: 4.6 mmol/L (ref 3.5–5.3)
Sodium: 140 mmol/L (ref 135–146)
TOTAL PROTEIN: 6.9 g/dL (ref 6.1–8.1)

## 2016-09-14 LAB — TSH: TSH: 0.9 m[IU]/L (ref 0.40–4.50)

## 2016-09-15 ENCOUNTER — Other Ambulatory Visit: Payer: Self-pay | Admitting: Orthopedic Surgery

## 2016-11-01 ENCOUNTER — Ambulatory Visit (INDEPENDENT_AMBULATORY_CARE_PROVIDER_SITE_OTHER): Payer: BC Managed Care – PPO | Admitting: Physician Assistant

## 2016-11-01 ENCOUNTER — Encounter: Payer: Self-pay | Admitting: Physician Assistant

## 2016-11-01 VITALS — BP 140/108 | Temp 98.0°F | Ht 71.0 in | Wt 250.0 lb

## 2016-11-01 DIAGNOSIS — I10 Essential (primary) hypertension: Secondary | ICD-10-CM

## 2016-11-01 MED ORDER — AMLODIPINE BESYLATE 5 MG PO TABS: 5.0000 mg | ORAL_TABLET | Freq: Every day | ORAL | 5 refills | Status: DC

## 2016-11-01 NOTE — Progress Notes (Signed)
Patient ID: Anthony Sharp MRN: [id number], DOB: 03/02/1991 26 y.o. Date of Encounter: 11/01/2016, 12:03 PM    Chief Complaint: Donn Pierini, ? Anxious, weight gain  HPI: 26 y.o. y/o male here for  Above.   09/13/2016: New Patient/ Establish Care, PreOp Evaluation He has paper from [facility] that needs to be faxed in to them for preop surgery clearance.  He states that he is scheduled for surgery this upcoming Friday 09/15/16 at 12:30 with Dr. Rolena Infante.  Reviewed that his blood pressure is reading high today and reviewed that hypertension is documented on his history. He states that he "has been on countless blood pressure medicines" in the past-- back when he was around 26 years old.  Says that he "didn't feel right " when he was on the medicine so medicine ended up being discontinued and switched.  States that his father has high blood pressure and his brother has high blood pressure. Brother is only 43 years old but patient states that his brother is obese.   However he is agreeable to try blood pressure medication again at this point. Says that that was all about 10 years ago and medication may affect him differently now.  He does smoke 1 pack per day. Says that last Monday he got up on Monday morning and told himself  "I'm quitting, I'm done with this" and says that he smoked none all week. However on Sunday he started back smoking because he started getting nervous about the surgery. Says after his surgery he is going to quit.  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  States that he had no specific known injury that caused the problems to his back. Says that he just developed pain in his back and his right leg. Says that he gets very little sleep because of the pain and only sleeps about 1 hour because of the pain.  No other specific concerns to address  today.  AT THAT OV: Checked screening labs (except no FLP as he was not fasting) Also addressed HTN: Essential hypertension Given that he has been on multiple different medications in the past and had adverse effects will start with extremely low dose of medication.  If blood pressure not controlled with this he can increase to taking 2 of these at a time for 5 mg dose. Will use Norvasc since this will not require lab work monitoring. Will have him schedule follow-up office visit in 3 months----that will give him time to recuperate from his surgery and then come in for follow-up. - amLODipine (NORVASC) 2.5 MG tablet; Take 1 tablet (2.5 mg total) by mouth daily.  Dispense: 30 tablet; Refill: 3 Smoker He plans to quit smoking after his surgery. Will follow-up this at his follow-up visit in 3 months.    11/01/2016: Today he reports he did have the surgery on 09/15/16. Says that "when he got in there, it was worse than he had expected". States that they have decided for him to stay out of work 6 more weeks--- says they "were afraid to let me go back to work even light duty because if I did something to hurt my back---" He states that since being out of work and unable to do any physical activity at times he feels jittery and almost a little anxious. Says other days he feels almost a  little depressed. Says he doesn't know if it is because he is sitting at home unable to do anything or not. Says he can't go to the gym and can't go to work. Says that he also has noticed that his weight is up. Says that he has stopped alcohol and soda and is drinking water. Says that he was also consuming some caffeine but realized that was making him more jittery so he has stopped the caffeine for the past 3 or 4 days..Had been drinking both coffee and soda. Also wanted [place] to make the aware that he could not take Percocet says that made him itch and sweat.  He reports that he has been taking the blood pressure pill daily  that I added at his last visit. States that is causing no adverse effects. No other concerns to address today.     Review of Systems: Consitutional: No fever, chills, fatigue, night sweats, lymphadenopathy, or weight changes. Eyes: No visual changes, eye redness, or discharge. ENT/Mouth: Ears: No otalgia, tinnitus, hearing loss, discharge. Nose: No congestion, rhinorrhea, sinus pain, or epistaxis. Throat: No sore throat, post nasal drip, or teeth pain. Cardiovascular: No CP, palpitations, diaphoresis, DOE, edema, orthopnea, PND. Respiratory: No cough, hemoptysis, SOB, or wheezing. Gastrointestinal: No anorexia, dysphagia, reflux, pain, nausea, vomiting, hematemesis, diarrhea, constipation, BRBPR, or melena. Genitourinary: No dysuria, frequency, urgency, hematuria, incontinence, nocturia, decreased urinary stream, discharge, impotence, or testicular pain/masses. Musculoskeletal: ------------See HPI-------------------------- Skin: No rash, erythema, lesion changes, pain, warmth, jaundice, or pruritis. Neurological: No headache, dizziness, syncope, seizures, tremors, memory loss, coordination problems, or paresthesias. Psychological: No anxiety, depression, hallucinations, SI/HI. Endocrine: No fatigue, polydipsia, polyphagia, polyuria, or known diabetes. All other systems were reviewed and are otherwise negative.  Past Medical History:  Diagnosis Date  . Hypertension      No past surgical history on file.  Home Meds:  Outpatient Medications Prior to Visit  Medication Sig Dispense Refill  . acetaminophen (TYLENOL) 500 MG tablet Take 500 mg by mouth every 6 (six) hours as needed for mild pain or moderate pain.    Marland Kitchen ibuprofen (ADVIL,MOTRIN) 200 MG tablet Take 200 mg by mouth every 6 (six) hours as needed for headache, mild pain or moderate pain.    Marland Kitchen amLODipine (NORVASC) 2.5 MG tablet Take 1 tablet (2.5 mg total) by mouth daily. 30 tablet 3   No facility-administered medications prior to  visit.     Allergies:  Allergies  Allergen Reactions  . Bee Venom Swelling    Social History   Social History  . Marital status: Single    Spouse name: N/A  . Number of children: N/A  . Years of education: N/A   Occupational History  . Not on file.   Social History Main Topics  . Smoking status: Current Every Day Smoker    Packs/day: 1.00    Types: Cigarettes  . Smokeless tobacco: Current User    Types: Snuff  . Alcohol use Yes     Comment: socially  . Drug use: No  . Sexual activity: Not on file   Other Topics Concern  . Not on file   Social History Narrative  . No narrative on file    Family History  Problem Relation Age of Onset  . Hypertension Father   . Hypertension Brother     Physical Exam: Blood pressure (!) 140/108, temperature 98 F (36.7 C), height 5\' 11"  (1.803 m), weight 250 lb (113.4 kg), SpO2 98 %.  General: Well developed, well nourished WM.  Lots of tattoos. Appears in no acute distress. Neck: Supple. Trachea midline. No thyromegaly. Full ROM. No lymphadenopathy. Lungs: Clear to auscultation bilaterally without wheezes, rales, or rhonchi. Breathing is of normal effort and unlabored. Cardiovascular: RRR with S1 S2. No murmurs, rubs, or gallops. Distal pulses 2+ symmetrically. No carotid or abdominal bruits. Musculoskeletal: Full range of motion and 5/5 strength throughout.  Skin: Warm and moist without erythema, ecchymosis, wounds, or rash. Neuro: A+Ox3. CN II-XII grossly intact. Moves all extremities spontaneously. Full sensation throughout. Normal gait. Psych:  Responds to questions appropriately with a normal affect.   Assessment/Plan:  26 y.o. y/o white male here for   ? Anxiety/Depression Discussed with him that what he is reporting sounds like a normal response for anyone who is used to being physically active and working on a daily basis and to now be sitting "with nothing to do ".  Discussed finding things to keep his mind  busy.  Weight Gain Discussed with him that the weight gain is secondary to him being sedentary as well. Discussed that right now while he cannot be as physically active as he had been, he will have to be extra careful with his dietary intake. Recommend continuing to drink water and avoiding drinks with calories and caffeine. Also discussed adjusting his diet and discussed specific foods to decrease and also to watch portions.  Essential hypertension I rechecked his blood pressure myself. On the left am getting 140/110 and on the right 150/110. Will have him increase amlodipine to 5 mg daily. Will have him return for follow-up visit in 2 weeks while it is easy him for him to return while he is out of work. - amLODipine (NORVASC) 5 MG tablet; Take 1 tablet (5 mg total) by mouth daily.  Dispense: 30 tablet; Refill: 5   Smoking At his prior visit 09/13/16 he reported that he was smoking but planned to quit smoking after his surgery. I did not address this at his visit 11/01/16 but when he returns in 2 weeks I will follow up this.  Follow up office visit 2 weeks. Follow-up sooner if needed.  Signed:   [address] [place], [place]  11/01/2016 12:03 PM

## 2016-11-15 ENCOUNTER — Ambulatory Visit: Payer: BC Managed Care – PPO | Admitting: Physician Assistant

## 2016-11-21 ENCOUNTER — Encounter: Payer: Self-pay | Admitting: Physician Assistant

## 2016-12-14 ENCOUNTER — Ambulatory Visit: Payer: BC Managed Care – PPO | Admitting: Physician Assistant

## 2016-12-22 ENCOUNTER — Encounter: Payer: Self-pay | Admitting: Physician Assistant

## 2017-05-07 ENCOUNTER — Other Ambulatory Visit: Payer: Self-pay | Admitting: Physician Assistant

## 2017-05-07 DIAGNOSIS — I10 Essential (primary) hypertension: Secondary | ICD-10-CM

## 2017-05-21 ENCOUNTER — Encounter: Payer: Self-pay | Admitting: Physician Assistant

## 2017-05-21 ENCOUNTER — Ambulatory Visit: Payer: BC Managed Care – PPO | Admitting: Physician Assistant

## 2017-05-21 ENCOUNTER — Other Ambulatory Visit: Payer: Self-pay

## 2017-05-21 VITALS — BP 150/110 | HR 100 | Temp 98.0°F | Resp 16 | Ht 71.0 in | Wt 246.0 lb

## 2017-05-21 DIAGNOSIS — F17200 Nicotine dependence, unspecified, uncomplicated: Secondary | ICD-10-CM

## 2017-05-21 DIAGNOSIS — J988 Other specified respiratory disorders: Secondary | ICD-10-CM | POA: Diagnosis not present

## 2017-05-21 DIAGNOSIS — R52 Pain, unspecified: Secondary | ICD-10-CM | POA: Diagnosis not present

## 2017-05-21 DIAGNOSIS — J029 Acute pharyngitis, unspecified: Secondary | ICD-10-CM | POA: Diagnosis not present

## 2017-05-21 DIAGNOSIS — I10 Essential (primary) hypertension: Secondary | ICD-10-CM

## 2017-05-21 DIAGNOSIS — B9789 Other viral agents as the cause of diseases classified elsewhere: Secondary | ICD-10-CM | POA: Diagnosis not present

## 2017-05-21 LAB — INFLUENZA A AND B AG, IMMUNOASSAY
INFLUENZA A ANTIGEN: NOT DETECTED
INFLUENZA B ANTIGEN: NOT DETECTED

## 2017-05-21 MED ORDER — CETIRIZINE HCL 10 MG PO CAPS: 1.0000 | ORAL_CAPSULE | Freq: Every day | ORAL | 1 refills | Status: DC | PRN

## 2017-05-21 NOTE — Progress Notes (Signed)
Patient ID: Anthony Sharp MRN: [id number], DOB: 04-11-1990, 27 y.o. Date of Encounter: 05/21/2017, 9:55 AM    Chief Complaint:  Chief Complaint  Patient presents with  . Tick Removal    x2days  . Nausea    x2days  . Generalized Body Aches    x2days  . Sore Throat    x2days  . low grade fever     HPI: 27 y.o. year old male presents with above.   Says that on Saturday around 1 or 2 PM is when the symptoms started.  Says that since then he has had some nausea off and on but has not actually vomited.  Has had no diarrhea.  No abdominal pain.  His head has felt stopped up.  Eyes have been watering.  Nose has been running-- watery clear rhinorrhea.  Asked if he usually has "allergies "he states that he usually does not have much problem with that.  States that his throat has not been severely sore-- just a little bit irritated.  Says that he does not think any of the symptoms are related to the tick bite-- that he did find a tick on his right upper leg about 2 hours prior to these symptoms starting.  Says that he thinks that the tick had not even gotten embedded.  Says that he just felt something on his leg and rubbed his leg and then looked and it was a tick in his hand but he did not have to "remove it ".  He states that he checked temperature at home this morning and got 99.5 first thing this morning.  He notes that nurse temperature check here is normal.  Noted that his blood pressure is reading high.  He states that he is taking the amlodipine daily and is not is compliant with taking this on a daily basis.       Home Meds:   Outpatient Medications Prior to Visit  Medication Sig Dispense Refill  . acetaminophen (TYLENOL) 500 MG tablet Take 500 mg by mouth every 6 (six) hours as needed for mild pain or moderate pain.    Marland Kitchen amLODipine (NORVASC) 5 MG tablet TAKE 1 TABLET BY MOUTH ONCE DAILY 30 tablet 0  . ibuprofen (ADVIL,MOTRIN) 200 MG tablet Take 200 mg by mouth every 6  (six) hours as needed for headache, mild pain or moderate pain.     No facility-administered medications prior to visit.     Allergies:  Allergies  Allergen Reactions  . Bee Venom Swelling  . Oxycodone Itching      Review of Systems: See HPI for pertinent ROS. All other ROS negative.    Physical Exam: Blood pressure (!) 150/110, pulse 100, temperature 98 F (36.7 C), temperature source Oral, resp. rate 16, height 5\' 11"  (1.803 m), weight 111.6 kg (246 lb), SpO2 98 %., Body mass index is 34.31 kg/m. General:  WNWD WM. Appears in no acute distress. HEENT: Normocephalic, atraumatic, eyes without discharge, sclera non-icteric, nares are without discharge. Bilateral auditory canals clear, TM's are without perforation, pearly grey and translucent with reflective cone of light bilaterally. Oral cavity moist, posterior pharynx and bilateral tonsils with mild - moderate diffuse erythema. No exudate, no peritonsillar abscess.  Neck: Supple. No thyromegaly. No lymphadenopathy.  I feel no enlarged nodes and he reports that no lobe nodes are tender with palpation. Lungs: Clear bilaterally to auscultation without wheezes, rales, or rhonchi. Breathing is unlabored. Heart: Regular rhythm. No murmurs, rubs, or gallops. Msk:  Strength and tone normal for age. Extremities/Skin: Warm and dry.No rashes.  Very tiny 1 mm pink dot on right anterior distal thigh.  No other rash. Neuro: Alert and oriented X 3. Moves all extremities spontaneously. Gait is normal. CNII-XII grossly in tact. Psych:  Responds to questions appropriately with a normal affect.     ASSESSMENT AND PLAN:  27 y.o. year old male with  1. Viral respiratory infection Strep test and influenza test are negative. Discussed that at this time symptoms are consistent with a viral illness.  Recommend that he takes Zyrtec to dry up drainage and I gave him some samples of Norel AD to use for congestion symptoms.  Note given for out of work today  and tomorrow with plans to return Wednesday. He is to follow-up if he has increasing fever or fever not resolved in 24-48 hours.  Also follow-up if any of his symptoms worsen significantly or if symptoms persist greater than 7 days. Regarding the tick bite -- at this point I think that that is a coincidence and his current symptoms are not related to the tick bite.  He is to follow-up if he has any persistent fever beyond 48 hours if he develops any rash or if his myalgias or other symptoms persist beyond a week.  - Cetirizine HCl (ZYRTEC ALLERGY) 10 MG CAPS; Take 1 capsule (10 mg total) by mouth daily as needed.  Dispense: 30 capsule; Refill: 1  2. Generalized body aches - Influenza A and B Ag, Immunoassay  3. Sore throat - STREP GROUP A AG, W/REFLEX TO CULT  4. Essential hypertension Reviewed with patient the blood pressure is elevated today.  He states that he is compliant with taking the amlodipine daily.  We will not adjust medicines based on blood pressure reading today as he is sick.  He will schedule follow-up visit with me in about 2 weeks to recheck BP once this illness is resolved.  He voices understanding and agrees.  5. Smoker See prior notes.  Will follow-up at follow-up visit.   Signed, [address] [place], [place], [facility] 05/21/2017 9:55 AM

## 2017-05-23 LAB — CULTURE, GROUP A STREP
MICRO NUMBER:: 90274779
SOURCE: 0
SPECIMEN QUALITY:: ADEQUATE

## 2017-05-23 LAB — STREP GROUP A AG, W/REFLEX TO CULT: Streptococcus, Group A Screen (Direct): NOT DETECTED

## 2017-06-04 ENCOUNTER — Ambulatory Visit: Payer: BC Managed Care – PPO | Admitting: Physician Assistant

## 2017-06-04 ENCOUNTER — Encounter: Payer: Self-pay | Admitting: Physician Assistant

## 2017-06-04 VITALS — BP 134/92 | HR 88 | Temp 98.3°F | Resp 16 | Ht 71.0 in | Wt 250.0 lb

## 2017-06-04 DIAGNOSIS — F17200 Nicotine dependence, unspecified, uncomplicated: Secondary | ICD-10-CM | POA: Diagnosis not present

## 2017-06-04 DIAGNOSIS — I10 Essential (primary) hypertension: Secondary | ICD-10-CM | POA: Diagnosis not present

## 2017-06-04 MED ORDER — AMLODIPINE BESYLATE 10 MG PO TABS: 10.0000 mg | ORAL_TABLET | Freq: Every day | ORAL | 11 refills | Status: DC

## 2017-06-04 NOTE — Progress Notes (Signed)
Patient ID: Anthony Sharp MRN: [id number], DOB: 1991/03/11 27 y.o. Date of Encounter: 06/04/2017, 2:16 PM    Chief Complaint: Donn Pierini, ? Anxious, weight gain  HPI: 27 y.o. y/o male here for  Above.   09/13/2016: New Patient/ Establish Care, PreOp Evaluation He has paper from [facility] that needs to be faxed in to them for preop surgery clearance.  He states that he is scheduled for surgery this upcoming Friday 09/15/16 at 12:30 with Dr. Rolena Infante.  Reviewed that his blood pressure is reading high today and reviewed that hypertension is documented on his history. He states that he "has been on countless blood pressure medicines" in the past-- back when he was around 27 years old.  Says that he "didn't feel right " when he was on the medicine so medicine ended up being discontinued and switched.  States that his father has high blood pressure and his brother has high blood pressure. Brother is only 77 years old but patient states that his brother is obese.   However he is agreeable to try blood pressure medication again at this point. Says that that was all about 10 years ago and medication may affect him differently now.  He does smoke 1 pack per day. Says that last Monday he got up on Monday morning and told himself  "I'm quitting, I'm done with this" and says that he smoked none all week. However on Sunday he started back smoking because he started getting nervous about the surgery. Says after his surgery he is going to quit.  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  States that he had no specific known injury that caused the problems to his back. Says that he just developed pain in his back and his right leg. Says that he gets very little sleep because of the pain and only sleeps about 1 hour because of the pain.  No other specific concerns to address  today.  AT THAT OV: Checked screening labs (except no FLP as he was not fasting) Also addressed HTN: Essential hypertension Given that he has been on multiple different medications in the past and had adverse effects will start with extremely low dose of medication.  If blood pressure not controlled with this he can increase to taking 2 of these at a time for 5 mg dose. Will use Norvasc since this will not require lab work monitoring. Will have him schedule follow-up office visit in 3 months----that will give him time to recuperate from his surgery and then come in for follow-up. - amLODipine (NORVASC) 2.5 MG tablet; Take 1 tablet (2.5 mg total) by mouth daily.  Dispense: 30 tablet; Refill: 3 Smoker He plans to quit smoking after his surgery. Will follow-up this at his follow-up visit in 3 months.    11/01/2016: Today he reports he did have the surgery on 09/15/16. Says that "when he got in there, it was worse than he had expected". States that they have decided for him to stay out of work 6 more weeks--- says they "were afraid to let me go back to work even light duty because if I did something to hurt my back---" He states that since being out of work and unable to do any physical activity at times he feels jittery and almost a little anxious. Says other days he feels almost a  little depressed. Says he doesn't know if it is because he is sitting at home unable to do anything or not. Says he can't go to the gym and can't go to work. Says that he also has noticed that his weight is up. Says that he has stopped alcohol and soda and is drinking water. Says that he was also consuming some caffeine but realized that was making him more jittery so he has stopped the caffeine for the past 3 or 4 days..Had been drinking both coffee and soda. Also wanted [place] to make the aware that he could not take Percocet says that made him itch and sweat.  He reports that he has been taking the blood pressure pill daily  that I added at his last visit. States that is causing no adverse effects. No other concerns to address today.  ASSESSMENT/PLAN AT OV 11/01/2016: ? Anxiety/Depression Discussed with him that what he is reporting sounds like a normal response for anyone who is used to being physically active and working on a daily basis and to now be sitting "with nothing to do ".  Discussed finding things to keep his mind busy.  Weight Gain Discussed with him that the weight gain is secondary to him being sedentary as well. Discussed that right now while he cannot be as physically active as he had been, he will have to be extra careful with his dietary intake. Recommend continuing to drink water and avoiding drinks with calories and caffeine. Also discussed adjusting his diet and discussed specific foods to decrease and also to watch portions.  Essential hypertension I rechecked his blood pressure myself. On the left am getting 140/110 and on the right 150/110. Will have him increase amlodipine to 5 mg daily. Will have him return for follow-up visit in 2 weeks while it is easy him for him to return while he is out of work. - amLODipine (NORVASC) 5 MG tablet; Take 1 tablet (5 mg total) by mouth daily.  Dispense: 30 tablet; Refill: 5   Smoking At his prior visit 09/13/16 he reported that he was smoking but planned to quit smoking after his surgery. I did not address this at his visit 11/01/16 but when he returns in 2 weeks I will follow up this.  Follow up office visit 2 weeks. Follow-up sooner if needed.   05/21/2017: At the office note from 05/21/2017 for full details.  That time he came in because he had been experiencing some nausea.  Head congestion.  Diarrhea.  Low-grade fever.  At that visit it was noted that his blood pressure was reading high at 150/110.  At that visit he was felt to have a viral respiratory infection.  Reviewed with patient the blood pressure was reading elevated that day.  Reported he had  been compliant with taking the amlodipine daily.  For him to schedule follow-up visit in 2 weeks to recheck blood pressure once his illness had resolved.    06/04/2017: Presents to recheck blood pressure.  He states that all of his symptoms that he was having at prior visit have resolved.  He reports that he is continue to take the Norvasc 5 mg daily.  He has had no blood pressure checks since his last visit here.  He has been experiencing no lower extremity edema.    Review of Systems: Consitutional: No fever, chills, fatigue, night sweats, lymphadenopathy, or weight changes. Eyes: No visual changes, eye redness, or discharge. ENT/Mouth: Ears: No otalgia, tinnitus, hearing loss, discharge.  Nose: No congestion, rhinorrhea, sinus pain, or epistaxis. Throat: No sore throat, post nasal drip, or teeth pain. Cardiovascular: No CP, palpitations, diaphoresis, DOE, edema, orthopnea, PND. Respiratory: No cough, hemoptysis, SOB, or wheezing. Gastrointestinal: No anorexia, dysphagia, reflux, pain, nausea, vomiting, hematemesis, diarrhea, constipation, BRBPR, or melena. Genitourinary: No dysuria, frequency, urgency, hematuria, incontinence, nocturia, decreased urinary stream, discharge, impotence, or testicular pain/masses. Musculoskeletal: ------------See HPI-------------------------- Skin: No rash, erythema, lesion changes, pain, warmth, jaundice, or pruritis. Neurological: No headache, dizziness, syncope, seizures, tremors, memory loss, coordination problems, or paresthesias. Psychological: No anxiety, depression, hallucinations, SI/HI. Endocrine: No fatigue, polydipsia, polyphagia, polyuria, or known diabetes. All other systems were reviewed and are otherwise negative.  Past Medical History:  Diagnosis Date  . Hypertension      History reviewed. No pertinent surgical history.  Home Meds:  Outpatient Medications Prior to Visit  Medication Sig Dispense Refill  . acetaminophen (TYLENOL) 500 MG  tablet Take 500 mg by mouth every 6 (six) hours as needed for mild pain or moderate pain.    . Cetirizine HCl (ZYRTEC ALLERGY) 10 MG CAPS Take 1 capsule (10 mg total) by mouth daily as needed. 30 capsule 1  . ibuprofen (ADVIL,MOTRIN) 200 MG tablet Take 200 mg by mouth every 6 (six) hours as needed for headache, mild pain or moderate pain.    Marland Kitchen amLODipine (NORVASC) 5 MG tablet TAKE 1 TABLET BY MOUTH ONCE DAILY 30 tablet 0   No facility-administered medications prior to visit.     Allergies:  Allergies  Allergen Reactions  . Bee Venom Swelling  . Oxycodone Itching    Social History   Socioeconomic History  . Marital status: Single    Spouse name: Not on file  . Number of children: Not on file  . Years of education: Not on file  . Highest education level: Not on file  Social Needs  . Financial resource strain: Not on file  . Food insecurity - worry: Not on file  . Food insecurity - inability: Not on file  . Transportation needs - medical: Not on file  . Transportation needs - non-medical: Not on file  Occupational History  . Not on file  Tobacco Use  . Smoking status: Current Every Day Smoker    Packs/day: 1.00    Types: Cigarettes  . Smokeless tobacco: Current User    Types: Snuff  Substance and Sexual Activity  . Alcohol use: Yes    Comment: socially  . Drug use: No  . Sexual activity: Not on file  Other Topics Concern  . Not on file  Social History Narrative  . Not on file    Family History  Problem Relation Age of Onset  . Hypertension Father   . Hypertension Brother     Physical Exam: Blood pressure (!) 134/92, pulse 88, temperature 98.3 F (36.8 C), temperature source Oral, resp. rate 16, height 5\' 11"  (1.803 m), weight 113.4 kg (250 lb), SpO2 95 %.  General: Well developed, well nourished WM. Lots of tattoos. Appears in no acute distress. Neck: Supple. Trachea midline. No thyromegaly. Full ROM. No lymphadenopathy. Lungs: Clear to auscultation bilaterally  without wheezes, rales, or rhonchi. Breathing is of normal effort and unlabored. Cardiovascular: RRR with S1 S2. No murmurs, rubs, or gallops. Distal pulses 2+ symmetrically. No carotid or abdominal bruits. Musculoskeletal: Full range of motion and 5/5 strength throughout.  Skin: Warm and moist without erythema, ecchymosis, wounds, or rash. Neuro: A+Ox3. CN II-XII grossly intact. Moves all extremities spontaneously. Full sensation throughout.  Normal gait. Psych:  Responds to questions appropriately with a normal affect.   Assessment/Plan:  27 y.o. y/o white male here for     Essential hypertension I rechecked his blood pressure myself. On the left am getting 142/92.  This is even using the adult large cuff. Will have him increase amlodipine to 10 mg daily.    Smoking At his prior visit 09/13/16 he reported that he was smoking but planned to quit smoking after his surgery. I did not address this at his visit today. NEED TO DISCUSS THIS AT NEXT OV.     Signed:   [address] [place], [place]  06/04/2017 2:16 PM

## 2017-08-16 IMAGING — DX DG CHEST 2V
2 series · 2 of 2 positions shown · non-contrast
Comparison: 09/06/2010

CLINICAL DATA: Fever and productive cough for 2 days. History of
hypertension. Smoker.

EXAM:
CHEST  2 VIEW

[chest pa]
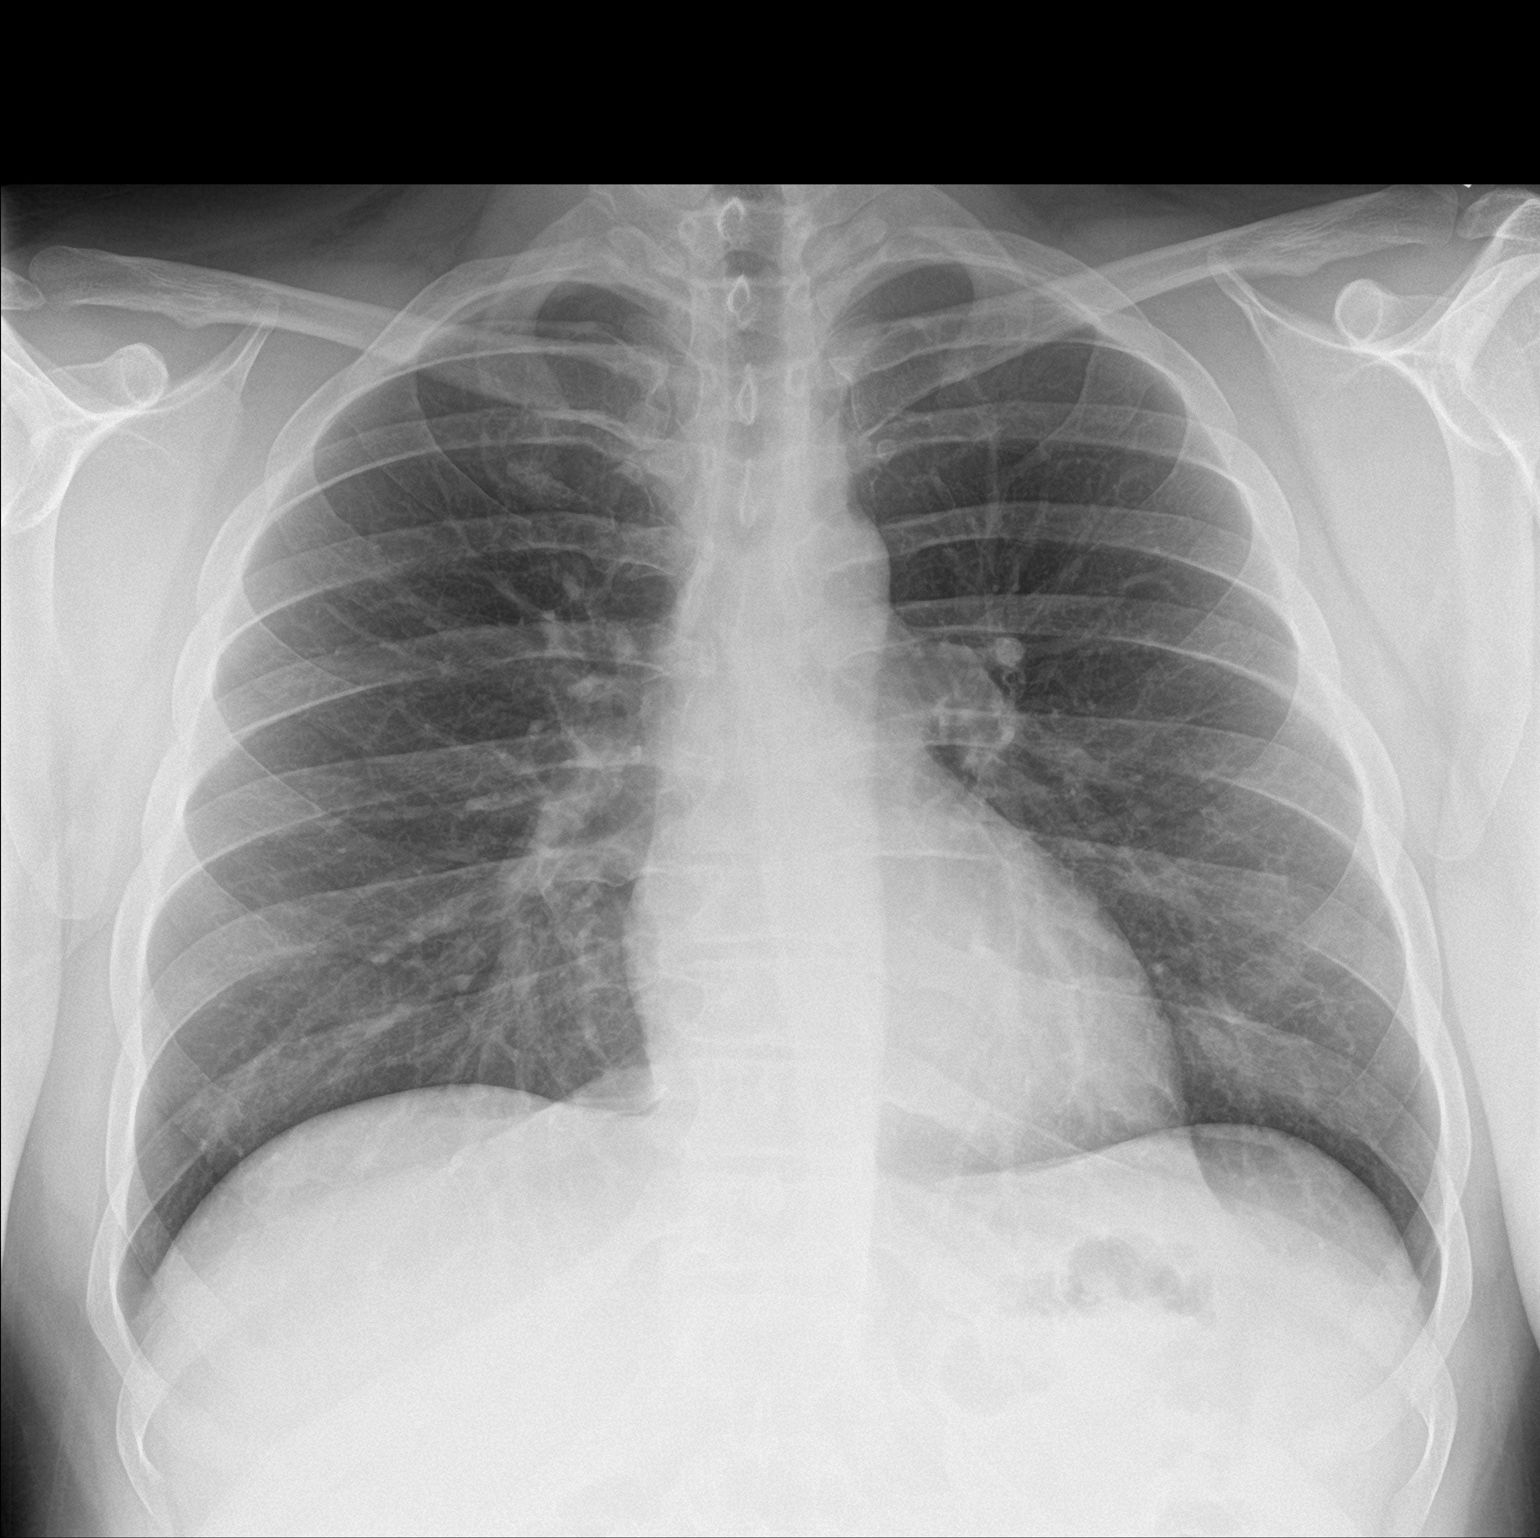

[chest lat]
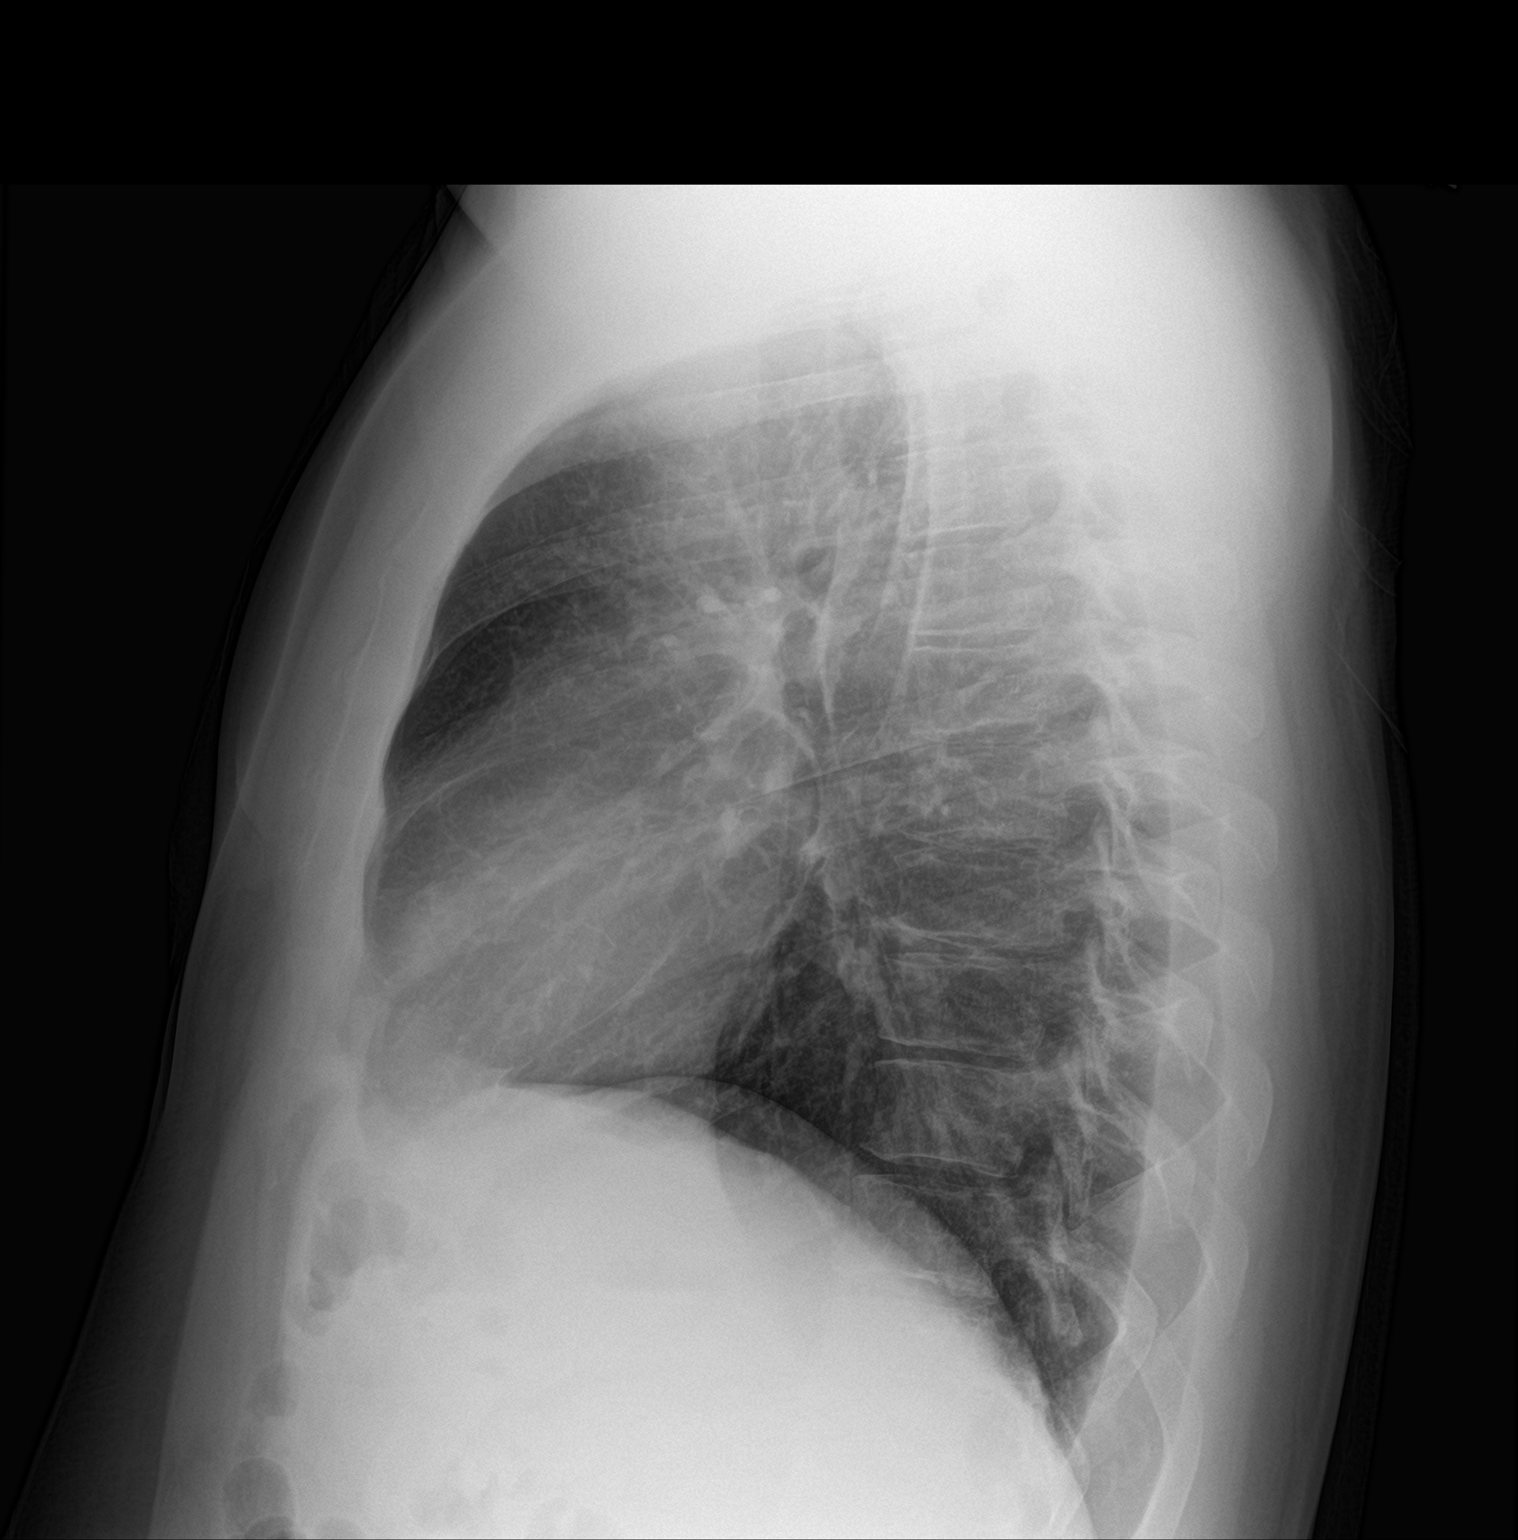

[2 of 2 positions shown; findings below may reference images not displayed]

FINDINGS: The heart size and mediastinal contours are within normal limits.
Both lungs are clear. The visualized skeletal structures are
unremarkable.
IMPRESSION: No active cardiopulmonary disease.

## 2017-12-06 ENCOUNTER — Ambulatory Visit: Payer: BC Managed Care – PPO | Admitting: Physician Assistant

## 2017-12-11 ENCOUNTER — Encounter: Payer: Self-pay | Admitting: Physician Assistant

## 2018-02-06 ENCOUNTER — Ambulatory Visit: Payer: BC Managed Care – PPO | Admitting: Family Medicine

## 2018-02-06 ENCOUNTER — Other Ambulatory Visit: Payer: Self-pay

## 2018-02-06 ENCOUNTER — Encounter: Payer: Self-pay | Admitting: Family Medicine

## 2018-02-06 DIAGNOSIS — I10 Essential (primary) hypertension: Secondary | ICD-10-CM

## 2018-02-06 DIAGNOSIS — J3489 Other specified disorders of nose and nasal sinuses: Secondary | ICD-10-CM | POA: Diagnosis not present

## 2018-02-06 DIAGNOSIS — E669 Obesity, unspecified: Secondary | ICD-10-CM | POA: Insufficient documentation

## 2018-02-06 DIAGNOSIS — F17200 Nicotine dependence, unspecified, uncomplicated: Secondary | ICD-10-CM | POA: Diagnosis not present

## 2018-02-06 DIAGNOSIS — Z6835 Body mass index (BMI) 35.0-35.9, adult: Secondary | ICD-10-CM

## 2018-02-06 LAB — CBC WITH DIFFERENTIAL/PLATELET
BASOS ABS: 59 {cells}/uL (ref 0–200)
Basophils Relative: 0.8 %
EOS ABS: 725 {cells}/uL — AB (ref 15–500)
Eosinophils Relative: 9.8 %
HCT: 52.2 % — ABNORMAL HIGH (ref 38.5–50.0)
HEMOGLOBIN: 18.3 g/dL — AB (ref 13.2–17.1)
Lymphs Abs: 1561 cells/uL (ref 850–3900)
MCH: 31.1 pg (ref 27.0–33.0)
MCHC: 35.1 g/dL (ref 32.0–36.0)
MCV: 88.8 fL (ref 80.0–100.0)
MONOS PCT: 15.1 %
MPV: 10.6 fL (ref 7.5–12.5)
Neutro Abs: 3937 cells/uL (ref 1500–7800)
Neutrophils Relative %: 53.2 %
Platelets: 205 10*3/uL (ref 140–400)
RBC: 5.88 10*6/uL — ABNORMAL HIGH (ref 4.20–5.80)
RDW: 12.2 % (ref 11.0–15.0)
TOTAL LYMPHOCYTE: 21.1 %
WBC: 7.4 10*3/uL (ref 3.8–10.8)
WBCMIX: 1117 {cells}/uL — AB (ref 200–950)

## 2018-02-06 LAB — COMPREHENSIVE METABOLIC PANEL
AG RATIO: 1.7 (calc) (ref 1.0–2.5)
ALKALINE PHOSPHATASE (APISO): 59 U/L (ref 40–115)
ALT: 29 U/L (ref 9–46)
AST: 24 U/L (ref 10–40)
Albumin: 4.5 g/dL (ref 3.6–5.1)
BUN: 13 mg/dL (ref 7–25)
CALCIUM: 9.2 mg/dL (ref 8.6–10.3)
CO2: 28 mmol/L (ref 20–32)
CREATININE: 1.07 mg/dL (ref 0.60–1.35)
Chloride: 104 mmol/L (ref 98–110)
GLUCOSE: 80 mg/dL (ref 65–99)
Globulin: 2.7 g/dL (calc) (ref 1.9–3.7)
POTASSIUM: 4.4 mmol/L (ref 3.5–5.3)
SODIUM: 139 mmol/L (ref 135–146)
Total Bilirubin: 0.7 mg/dL (ref 0.2–1.2)
Total Protein: 7.2 g/dL (ref 6.1–8.1)

## 2018-02-06 LAB — LIPID PANEL
CHOL/HDL RATIO: 4.4 (calc) (ref <5.0)
CHOLESTEROL: 170 mg/dL (ref <200)
HDL: 39 mg/dL — ABNORMAL LOW (ref >40)
LDL Cholesterol (Calc): 101 mg/dL (calc) — ABNORMAL HIGH
Non-HDL Cholesterol (Calc): 131 mg/dL (calc) — ABNORMAL HIGH (ref <130)
TRIGLYCERIDES: 188 mg/dL — AB (ref <150)

## 2018-02-06 MED ORDER — CETIRIZINE HCL 10 MG PO CAPS: 1.0000 | ORAL_CAPSULE | Freq: Every day | ORAL | 1 refills | Status: DC | PRN

## 2018-02-06 MED ORDER — AMLODIPINE BESYLATE 10 MG PO TABS: 10.0000 mg | ORAL_TABLET | Freq: Every day | ORAL | 11 refills | Status: DC

## 2018-02-06 NOTE — Progress Notes (Signed)
   Subjective:    Patient ID: Anthony Sharp, male    DOB: February 09, 1991, 27 y.o.   MRN: [id number]  Patient presents for Illness (x4 days- nasal congestion/ drainge with clear mucus, sinus pressure under eyes, intermittent HA, productive cough)  Nasal congestion and drainage for past 4 days, has had mild headache but not severe. Has mild cough.  He has taken nyquil and dayquil. He has not taken any allergy medications  Smokes 10-12 cig/day  HTN- taking meds without any difficulty. He had lost weight with Keto diet , but then read it wasn't good long term, he was very strict with his carbs < 20g a day and could not sustain and weight came back very quickly    Review Of Systems:  GEN- denies fatigue, fever, weight loss,weakness, recent illness HEENT- denies eye drainage, change in vision, +nasal discharge, CVS- denies chest pain, palpitations RESP- denies SOB, cough, wheeze ABD- denies N/V, change in stools, abd pain GU- denies dysuria, hematuria, dribbling, incontinence MSK- denies joint pain, muscle aches, injury Neuro- denies headache, dizziness, syncope, seizure activity       Objective:    BP 124/68   Pulse 82   Temp 98.6 F (37 C) (Oral)   Resp 18   Ht 5\' 11"  (1.803 m)   Wt 253 lb (114.8 kg)   SpO2 95%   BMI 35.29 kg/m  GEN- NAD, alert and oriented x3,well appearing HEENT- PERRL, EOMI, non injected sclera, pink conjunctiva, MMM, oropharynx clear,clear rhinorrhea, nasal congestion,no sinus tenderness, TM clear no effusion Neck- Supple, no thyromegaly, no LAD CVS- RRR, no murmur RESP-CTAB EXT- No edema Pulses- Radial  2+        Assessment & Plan:      Problem List Items Addressed This Visit      Unprioritized   Hypertension    Controlled, no changes to norvasc Expect with weight loss and exercise he would be able to titrate off bp meds      Relevant Medications   amLODipine (NORVASC) 10 MG tablet   Other Relevant Orders   CBC with  Differential/Platelet (Completed)   Comprehensive metabolic panel (Completed)   Lipid panel (Completed)   Obesity    Discussed lower carbs. But not as strict as his previous keto diet that he could not maintain      Smoker    Counseled on tobacco cessation       Other Visit Diagnoses    Nasal congestion with rhinorrhea       no sign of sinusitis at this time, discussed progression of illness, treat symptoms oral anti-histamine/nasal steroid mist    Relevant Medications   Cetirizine HCl (ZYRTEC ALLERGY) 10 MG CAPS      Note: This dictation was prepared with Dragon dictation along with smaller phrase technology. Any transcriptional errors that result from this process are unintentional.

## 2018-02-06 NOTE — Patient Instructions (Addendum)
Continue current meds Use flonase Semi-Mist  We will call with lab  Call if not improved F/U 6 months for Physical

## 2018-02-07 ENCOUNTER — Encounter: Payer: Self-pay | Admitting: Family Medicine

## 2018-02-07 NOTE — Assessment & Plan Note (Signed)
Counseled on tobacco cessation 

## 2018-02-07 NOTE — Assessment & Plan Note (Signed)
Discussed lower carbs. But not as strict as his previous keto diet that he could not maintain

## 2018-02-07 NOTE — Assessment & Plan Note (Signed)
Controlled, no changes to norvasc Expect with weight loss and exercise he would be able to titrate off bp meds

## 2018-02-21 ENCOUNTER — Encounter: Payer: Self-pay | Admitting: *Deleted

## 2018-06-18 ENCOUNTER — Other Ambulatory Visit: Payer: Self-pay

## 2018-06-18 ENCOUNTER — Ambulatory Visit (INDEPENDENT_AMBULATORY_CARE_PROVIDER_SITE_OTHER): Payer: BC Managed Care – PPO | Admitting: Family Medicine

## 2018-06-18 ENCOUNTER — Encounter: Payer: Self-pay | Admitting: Family Medicine

## 2018-06-18 DIAGNOSIS — Z6379 Other stressful life events affecting family and household: Secondary | ICD-10-CM

## 2018-06-18 DIAGNOSIS — F410 Panic disorder [episodic paroxysmal anxiety] without agoraphobia: Secondary | ICD-10-CM

## 2018-06-18 MED ORDER — ALPRAZOLAM 0.5 MG PO TABS: 0.5000 mg | ORAL_TABLET | Freq: Two times a day (BID) | ORAL | 0 refills | Status: DC | PRN

## 2018-06-18 MED ORDER — SERTRALINE HCL 25 MG PO TABS: 25.0000 mg | ORAL_TABLET | Freq: Every day | ORAL | 1 refills | Status: DC

## 2018-06-18 NOTE — Progress Notes (Signed)
Virtual Visit via Telephone Note  I connected with Anthony Sharp on 06/18/18 at 2:48 by telephone and verified that I am speaking with the correct person using two identifiers.   Pt location- pt at work, but in [place]  Physician Location: at home, Vic Blackbird MD    I discussed the limitations, risks, security and privacy concerns of performing an evaluation and management service by telephone and the availability of in person appointments. I also discussed with the patient that there may be a patient responsible charge related to this service. The patient expressed understanding and agreed to proceed.   History of Present Illness:   Pt states he has had  personal issues for the past 1-2 years. Has had episodes of palpitations, sweating, shaking, feels a sense of doom or something bad is going to happen, now happening 1-2 times a week. Now getting worse in setting of the current state of the [place]. The episodes first started 6 months to a year ago but having more of them.  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   Observations/Objective: Normal speech over the phone  no active SI  Assessment and Plan: Panic attacks- he is describing panic attacks, no cardiovascular symptoms otherwise. Has been working on weight loss states he is down to 220 following low carb diet and bp controlled Significant family stress in background which is very heavy on his mind.  Will start zoloft 25mg  at bedtime, add xanax 0.5mg  BID as a  bridge, discussed SE of both medications and potential of addicting medication.  Will reassess in 3-4 weeks in office and recheck labs.    Follow Up Instructions:    I discussed the assessment and treatment plan with the patient. The patient was provided an opportunity to ask questions and all were answered. The patient agreed with the plan and demonstrated an understanding of the instructions.   The patient was advised to call back or seek an in-person evaluation if the symptoms worsen or if the condition fails to improve as anticipated.  I provided  15 minutes of non-face-to-face time during this encounter. End time: 3:03pm  Vic Blackbird, MD

## 2018-07-05 ENCOUNTER — Telehealth: Payer: Self-pay | Admitting: *Deleted

## 2018-07-05 MED ORDER — ALPRAZOLAM 0.5 MG PO TABS: 0.5000 mg | ORAL_TABLET | Freq: Two times a day (BID) | ORAL | 1 refills | Status: DC | PRN

## 2018-07-05 MED ORDER — SERTRALINE HCL 50 MG PO TABS: 25.0000 mg | ORAL_TABLET | Freq: Every day | ORAL | Status: DC

## 2018-07-05 NOTE — Telephone Encounter (Signed)
Call placed to patient and patient made aware.   Medication pended for refill.

## 2018-07-05 NOTE — Telephone Encounter (Signed)
Received call from patient. [phone]~ telephone.   Reports that he continues to have panic attacks, some that are waking him up at night. Reports that he has finished the Xanax, but continues to have issues. Reports that he does not feel the Sertraline is working.   Reports that he is in current attack. States that he has shakiness, heart racing, and inability to calm. Reports that this started around 12pm today, but has not eased (it is now 2:30pm).   Patient noted to be talking very fast and stinging sentences together.   Appointment scheduled for Monday for Televist to F/U.   Ok to refill Xanax??  Last office visit/ refill 06/18/2018.

## 2018-07-05 NOTE — Telephone Encounter (Signed)
Increase zoloft to 50mg  , he was only on the starting dose, so we need to keep titrating upward Okay to refill the xanax

## 2018-07-08 ENCOUNTER — Other Ambulatory Visit: Payer: Self-pay

## 2018-07-08 ENCOUNTER — Encounter: Payer: Self-pay | Admitting: Family Medicine

## 2018-07-08 ENCOUNTER — Ambulatory Visit (INDEPENDENT_AMBULATORY_CARE_PROVIDER_SITE_OTHER): Payer: BC Managed Care – PPO | Admitting: Family Medicine

## 2018-07-08 DIAGNOSIS — F410 Panic disorder [episodic paroxysmal anxiety] without agoraphobia: Secondary | ICD-10-CM

## 2018-07-08 DIAGNOSIS — F411 Generalized anxiety disorder: Secondary | ICD-10-CM | POA: Insufficient documentation

## 2018-07-08 NOTE — Progress Notes (Signed)
Virtual Visit via Telephone Note  I connected with Anthony Sharp on 07/08/18 at 2:38pm  by telephone and verified that I am speaking with the correct person using two identifiers.  Pt location: at home  Physician location: at home, Vic Blackbird MD  On call- patient and physician    I discussed the limitations, risks, security and privacy concerns of performing an evaluation and management service by telephone and the availability of in person appointments. I also discussed with the patient that there may be a patient responsible charge related to this service. The patient expressed understanding and agreed to proceed.   History of Present Illness: Pt called in last week continued panic attack episodes feeling on edge, I increased zoloft to 50mg  he started Friday evening, also using xanax. Last severe spell Friday afternoon, he was just sitting and became overwhelmed, felt his heart beating fast and started getting nervouse, he was out of the xanax so just sat until it passed. He took the zoloft 50mg  that night, over the weekend has felt good, went fishing which helps his nerves.  Only SE he has noticed is a few  vivid dreams  Denies feeling sad or depressed Observations/Objective: No acute distress over the phone  No SI, no apparent hallucinations Normal speech over phone, answers questions appropriately  Assessment and Plan: Panic attacks/ GAD- continue zoloft 50mg  in the evening, continue xanax, continue to titrate the medication as needed, F/u 2 weeks via phone see how he is doing Advised dreams can occur, will see if they settle down before changing meds   Follow Up Instructions:    I discussed the assessment and treatment plan with the patient. The patient was provided an opportunity to ask questions and all were answered. The patient agreed with the plan and demonstrated an understanding of the instructions.   The patient was advised to call back or seek an in-person  evaluation if the symptoms worsen or if the condition fails to improve as anticipated.  I provided 9 minutes of non-face-to-face time during this encounter. END Times:2:47  Vic Blackbird, MD

## 2018-07-16 ENCOUNTER — Ambulatory Visit: Payer: BC Managed Care – PPO | Admitting: Family Medicine

## 2018-07-22 ENCOUNTER — Encounter: Payer: Self-pay | Admitting: Family Medicine

## 2018-07-22 ENCOUNTER — Ambulatory Visit (INDEPENDENT_AMBULATORY_CARE_PROVIDER_SITE_OTHER): Payer: BC Managed Care – PPO | Admitting: Family Medicine

## 2018-07-22 ENCOUNTER — Other Ambulatory Visit: Payer: Self-pay

## 2018-07-22 DIAGNOSIS — F411 Generalized anxiety disorder: Secondary | ICD-10-CM | POA: Diagnosis not present

## 2018-07-22 DIAGNOSIS — F410 Panic disorder [episodic paroxysmal anxiety] without agoraphobia: Secondary | ICD-10-CM | POA: Diagnosis not present

## 2018-07-22 MED ORDER — FLUOXETINE HCL 20 MG PO TABS: 20.0000 mg | ORAL_TABLET | Freq: Every day | ORAL | 3 refills | Status: DC

## 2018-07-22 MED ORDER — ALPRAZOLAM 0.5 MG PO TABS: 0.5000 mg | ORAL_TABLET | Freq: Three times a day (TID) | ORAL | 1 refills | Status: DC | PRN

## 2018-07-22 NOTE — Progress Notes (Signed)
Virtual Visit via Telephone Note  I connected with Anthony Sharp on 07/22/18 at 2:41pm by telephone and verified that I am speaking with the correct person using two identifiers.   Pt location: at home Physician location- in office, [place], Vic Blackbird MD  On call- patient and physician    I discussed the limitations, risks, security and privacy concerns of performing an evaluation and management service by telephone and the availability of in person appointments. I also discussed with the patient that there may be a patient responsible charge related to this service. The patient expressed understanding and agreed to proceed.   History of Present Illness: Phone visit to follow-up his panic attacks and anxiety.  2 weeks ago his Zoloft was increased to 50 mg once a day.  He was continued on alprazolam twice a day as needed.  He states that he has been taking the alprazolam 3 times a day most days and it keeps him calm.  His sleep however is still disturbed he gets weird dreams and often wakes up at 130 and 330 like clockwork and cannot go back to sleep.  He also states that he was on melatonin 10 mg Gummies and now has to take the tablets because he cannot find them in the pharmacy and he does feel like this has changed his sleep as well.  He does not feel as much on edge but he does note that he is still taking the alprazolam more than he should.  He denies any suicidal ideations no hallucinations.  Denies any feelings of depression   Observations/Objective: Telephone visit- NAD noted, normal speech, very polite, answers questions appropriately   Assessment and Plan: GAD/Panic attacks-I think that using the Xanax 3 times a day is masking the effects of the Zoloft I do think that is actually working very well since he is using much more the benzo.  He is also getting a weird dreams and sleep disturbances which he did state at the last visit.  I was hoping that this will  calm down but it did not."  I discontinued the Zoloft and put him on Prozac 20 mg in its place continue the Xanax as a bridge to hopefully be able to continue to decrease the use of that.  Discussed the medications and how to switch over since he is on a low dose of the Zoloft I think that he can stop and start the Prozac in its place at the end of the week when he is not working.  We will follow-up again in a few weeks by phone  Follow Up Instructions: 2 weeks     I discussed the assessment and treatment plan with the patient. The patient was provided an opportunity to ask questions and all were answered. The patient agreed with the plan and demonstrated an understanding of the instructions.   The patient was advised to call back or seek an in-person evaluation if the symptoms worsen or if the condition fails to improve as anticipated.  I provided 13 minutes of non-face-to-face time during this encounter. End time:2:54pm  Vic Blackbird, MD

## 2018-08-05 ENCOUNTER — Encounter: Payer: Self-pay | Admitting: Family Medicine

## 2018-08-05 ENCOUNTER — Other Ambulatory Visit: Payer: Self-pay

## 2018-08-05 ENCOUNTER — Ambulatory Visit (INDEPENDENT_AMBULATORY_CARE_PROVIDER_SITE_OTHER): Payer: BC Managed Care – PPO | Admitting: Family Medicine

## 2018-08-05 DIAGNOSIS — F411 Generalized anxiety disorder: Secondary | ICD-10-CM | POA: Diagnosis not present

## 2018-08-05 DIAGNOSIS — F410 Panic disorder [episodic paroxysmal anxiety] without agoraphobia: Secondary | ICD-10-CM

## 2018-08-05 MED ORDER — TRAZODONE HCL 50 MG PO TABS: 25.0000 mg | ORAL_TABLET | Freq: Every evening | ORAL | 3 refills | Status: DC | PRN

## 2018-08-05 NOTE — Progress Notes (Signed)
Virtual Visit via Telephone Note  I connected with Anthony Sharp on 08/05/18 at 2:26pm by telephone and verified that I am speaking with the correct person using two identifiers.     Pt location: at home   Physician location:  In office, [facility], Vic Blackbird MD     On call: patient and physician   I discussed the limitations, risks, security and privacy concerns of performing an evaluation and management service by telephone and the availability of in person appointments. I also discussed with the patient that there may be a patient responsible charge related to this service. The patient expressed understanding and agreed to proceed.   History of Present Illness:  Telephone visit to f/u medications- my previous A/P  GAD/Panic attacks-I think that using the Xanax 3 times a day is masking the effects of the Zoloft I do think that is actually working very well since he is using much more the benzo.  He is also getting a weird dreams and sleep disturbances which he did state at the last visit.  I was hoping that this will calm down but it did not."  I discontinued the Zoloft and put him on Prozac 20 mg in its place continue the Xanax as a bridge to hopefully be able to continue to decrease the use of that.  Discussed the medications and how to switch over since he is on a low dose of the Zoloft I think that he can stop and start the Prozac in its place at the end of the week when he is not working.     Past 2 weeks  - Doing better on prozac,but still taking xanax at least twice a day, still not sleeping well. Last night went to sleep at 7:30 slept 2 hour, then back and forth for another 3 hours. No bad dreams with prozac  Feels better, mind is not racing, has not had any "spells" recently, no panic attacks, doing well during the day, except he is tired Still using melatonin, he falls asleep but does not stay asleep    Observations/Objective: NAD, normal speech     Assessment and Plan: GAD/Panic attacks- continue Prozac, he does have some improvement in his overall mood and anxiety mind racing.  He is trying to cut back the use of the alprazolam.  He continues to have difficulty with sleeping someone start him on trazodone 25 to 50 mg at bedtime as needed.  He will follow-up in the office in 1 month  Follow Up Instructions:  June 16th at 8am     I discussed the assessment and treatment plan with the patient. The patient was provided an opportunity to ask questions and all were answered. The patient agreed with the plan and demonstrated an understanding of the instructions.   The patient was advised to call back or seek an in-person evaluation if the symptoms worsen or if the condition fails to improve as anticipated.  I provided 10 minutes of non-face-to-face time during this encounter. End time : 2:36pm  Vic Blackbird, MD

## 2018-08-08 ENCOUNTER — Telehealth: Payer: Self-pay | Admitting: Family Medicine

## 2018-08-08 NOTE — Telephone Encounter (Signed)
Patient called in requesting a refill on his Xanax stating he will be going out of town in 3-4 days. His last refill was 07/22/2018 with #60. Last office visit 08/05/2018

## 2018-08-09 MED ORDER — ALPRAZOLAM 0.5 MG PO TABS: 0.5000 mg | ORAL_TABLET | Freq: Three times a day (TID) | ORAL | 1 refills | Status: DC | PRN

## 2018-08-09 NOTE — Telephone Encounter (Signed)
Medication refilled

## 2018-08-09 NOTE — Telephone Encounter (Signed)
Tried to call no answer

## 2018-09-03 ENCOUNTER — Ambulatory Visit: Payer: BC Managed Care – PPO | Admitting: Family Medicine

## 2018-09-03 ENCOUNTER — Encounter: Payer: Self-pay | Admitting: Family Medicine

## 2018-09-03 ENCOUNTER — Other Ambulatory Visit: Payer: Self-pay

## 2018-09-03 VITALS — BP 134/82 | HR 88 | Temp 98.6°F | Resp 16 | Ht 71.0 in | Wt 254.0 lb

## 2018-09-03 DIAGNOSIS — J3489 Other specified disorders of nose and nasal sinuses: Secondary | ICD-10-CM

## 2018-09-03 DIAGNOSIS — F410 Panic disorder [episodic paroxysmal anxiety] without agoraphobia: Secondary | ICD-10-CM | POA: Diagnosis not present

## 2018-09-03 DIAGNOSIS — F411 Generalized anxiety disorder: Secondary | ICD-10-CM

## 2018-09-03 DIAGNOSIS — Z6835 Body mass index (BMI) 35.0-35.9, adult: Secondary | ICD-10-CM

## 2018-09-03 DIAGNOSIS — R0981 Nasal congestion: Secondary | ICD-10-CM

## 2018-09-03 DIAGNOSIS — I10 Essential (primary) hypertension: Secondary | ICD-10-CM

## 2018-09-03 DIAGNOSIS — E66812 Obesity, class 2: Secondary | ICD-10-CM

## 2018-09-03 MED ORDER — ALPRAZOLAM 0.5 MG PO TABS: 0.5000 mg | ORAL_TABLET | Freq: Three times a day (TID) | ORAL | 1 refills | Status: DC | PRN

## 2018-09-03 MED ORDER — FLUOXETINE HCL 20 MG PO TABS: 20.0000 mg | ORAL_TABLET | Freq: Every day | ORAL | 6 refills | Status: DC

## 2018-09-03 MED ORDER — ZYRTEC ALLERGY 10 MG PO CAPS: 1.0000 | ORAL_CAPSULE | Freq: Every day | ORAL | 1 refills | Status: DC | PRN

## 2018-09-03 MED ORDER — AMLODIPINE BESYLATE 10 MG PO TABS: 10.0000 mg | ORAL_TABLET | Freq: Every day | ORAL | 11 refills | Status: DC

## 2018-09-03 NOTE — Assessment & Plan Note (Signed)
He is working on dietary changes, has multiple layers of clothes today and boots Home weighs 245lbs

## 2018-09-03 NOTE — Assessment & Plan Note (Signed)
Well controlled, no changes to meds 

## 2018-09-03 NOTE — Assessment & Plan Note (Signed)
Improved with prozac, trazodone for sleep and prn xanax No change to meds F/u 3 months see how things are

## 2018-09-03 NOTE — Patient Instructions (Signed)
F/U 3 months Physical   

## 2018-09-03 NOTE — Progress Notes (Signed)
Subjective:    Patient ID: Anthony Sharp, male    DOB: 1990/03/28, 28 y.o.   MRN: [id number]  Patient presents for Follow-up (is fasting)  Patient here to follow-up anxiety and panic attacks.  We sent multiple some notes in the setting of COVID-19.  He is currently on Prozac 20 mg daily as well as trazodone 25 mg at bedtime and uses alprazolam for panic attacks.  States that he has not had to use alprazolam very much.  He occasionally will wakes up and has a panic attack but for the most part those pills have stopped.  He has done well throughout his workday rarely gets anxious.  He feels that the medications are doing well for him.  He does admit that he has been eating a little bit more often craves sweets and his weight did increase especially when he was home from the job during [place] he seemed to been snacking more.  Now he is back active and on his regular work schedule.  Prozac 20mg  once a day      Was at  219lbs 4 months ago admits was snacking more at home at of boredom during [place] he is taking amlodipine as prescribed no side effects with the medication.  He is due for repeat lipid panel as well.  Review Of Systems:  GEN- denies fatigue, fever, weight loss,weakness, recent illness HEENT- denies eye drainage, change in vision, nasal discharge, CVS- denies chest pain, palpitations RESP- denies SOB, cough, wheeze ABD- denies N/V, change in stools, abd pain GU- denies dysuria, hematuria, dribbling, incontinence MSK- denies joint pain, muscle aches, injury Neuro- denies headache, dizziness, syncope, seizure activity       Objective:    BP 134/82   Pulse 88   Temp 98.6 F (37 C) (Oral)   Resp 16   Ht 5\' 11"  (1.803 m)   Wt 254 lb (115.2 kg)   BMI 35.43 kg/m  GEN- NAD, alert and oriented x3 HEENT- PERRL, EOMI, non injected sclera, pink conjunctiva, MMM, oropharynx clear Neck- Supple, no thyromegaly CVS- RRR, no murmur RESP-CTAB Psych- normal affect and   Mood  EXT- No edema Pulses- Radial, 2+  PHQ 9 score 2        Assessment & Plan:      Problem List Items Addressed This Visit      Unprioritized   GAD (generalized anxiety disorder)    Improved with prozac, trazodone for sleep and prn xanax No change to meds F/u 3 months see how things are      Relevant Medications   FLUoxetine (PROZAC) 20 MG tablet   ALPRAZolam (XANAX) 0.5 MG tablet   Hypertension - Primary    Well controlled, no changes to meds       Relevant Medications   amLODipine (NORVASC) 10 MG tablet   Other Relevant Orders   CBC with Differential/Platelet   Comprehensive metabolic panel   Lipid panel   Obesity    He is working on dietary changes, has multiple layers of clothes today and boots Home weighs 245lbs      Panic attacks   Relevant Medications   FLUoxetine (PROZAC) 20 MG tablet   ALPRAZolam (XANAX) 0.5 MG tablet    Other Visit Diagnoses    Nasal congestion with rhinorrhea       no sign of sinusitis at this time, discussed progression of illness, treat symptoms oral anti-histamine/nasal steroid mist    Relevant Medications   Cetirizine HCl (  ZYRTEC ALLERGY) 10 MG CAPS      Note: This dictation was prepared with Dragon dictation along with smaller phrase technology. Any transcriptional errors that result from this process are unintentional.

## 2018-09-04 LAB — COMPREHENSIVE METABOLIC PANEL
AG Ratio: 1.7 (calc) (ref 1.0–2.5)
ALT: 21 U/L (ref 9–46)
AST: 17 U/L (ref 10–40)
Albumin: 4.5 g/dL (ref 3.6–5.1)
Alkaline phosphatase (APISO): 54 U/L (ref 36–130)
BUN: 13 mg/dL (ref 7–25)
CO2: 26 mmol/L (ref 20–32)
Calcium: 9.4 mg/dL (ref 8.6–10.3)
Chloride: 102 mmol/L (ref 98–110)
Creat: 1.06 mg/dL (ref 0.60–1.35)
Globulin: 2.6 g/dL (calc) (ref 1.9–3.7)
Glucose, Bld: 94 mg/dL (ref 65–99)
Potassium: 4.6 mmol/L (ref 3.5–5.3)
Sodium: 138 mmol/L (ref 135–146)
Total Bilirubin: 0.7 mg/dL (ref 0.2–1.2)
Total Protein: 7.1 g/dL (ref 6.1–8.1)

## 2018-09-04 LAB — CBC WITH DIFFERENTIAL/PLATELET
Absolute Monocytes: 646 cells/uL (ref 200–950)
Basophils Absolute: 51 cells/uL (ref 0–200)
Basophils Relative: 0.8 %
Eosinophils Absolute: 531 cells/uL — ABNORMAL HIGH (ref 15–500)
Eosinophils Relative: 8.3 %
HCT: 51.6 % — ABNORMAL HIGH (ref 38.5–50.0)
Hemoglobin: 17.8 g/dL — ABNORMAL HIGH (ref 13.2–17.1)
Lymphs Abs: 1734 cells/uL (ref 850–3900)
MCH: 32.1 pg (ref 27.0–33.0)
MCHC: 34.5 g/dL (ref 32.0–36.0)
MCV: 93.1 fL (ref 80.0–100.0)
MPV: 10.9 fL (ref 7.5–12.5)
Monocytes Relative: 10.1 %
Neutro Abs: 3437 cells/uL (ref 1500–7800)
Neutrophils Relative %: 53.7 %
Platelets: 190 10*3/uL (ref 140–400)
RBC: 5.54 10*6/uL (ref 4.20–5.80)
RDW: 13 % (ref 11.0–15.0)
Total Lymphocyte: 27.1 %
WBC: 6.4 10*3/uL (ref 3.8–10.8)

## 2018-09-04 LAB — LIPID PANEL
Cholesterol: 186 mg/dL (ref <200)
HDL: 56 mg/dL (ref >=40)
LDL Cholesterol (Calc): 97 mg/dL (calc)
Non-HDL Cholesterol (Calc): 130 mg/dL (calc) — ABNORMAL HIGH (ref <130)
Total CHOL/HDL Ratio: 3.3 (calc) (ref <5.0)
Triglycerides: 213 mg/dL — ABNORMAL HIGH (ref <150)

## 2018-09-05 ENCOUNTER — Encounter: Payer: Self-pay | Admitting: *Deleted

## 2018-09-09 ENCOUNTER — Encounter: Payer: BC Managed Care – PPO | Admitting: Family Medicine

## 2018-10-07 ENCOUNTER — Other Ambulatory Visit: Payer: Self-pay | Admitting: *Deleted

## 2018-10-07 MED ORDER — ALPRAZOLAM 0.5 MG PO TABS: 0.5000 mg | ORAL_TABLET | Freq: Three times a day (TID) | ORAL | 1 refills | Status: DC | PRN

## 2018-10-07 NOTE — Telephone Encounter (Signed)
I recommend he decrease to just twice a day for xanax. I have sent refill

## 2018-10-07 NOTE — Telephone Encounter (Signed)
Call placed to patient and patient made aware.  

## 2018-10-07 NOTE — Telephone Encounter (Signed)
Received call from patient [phone] telephone.   Requested refill on Xanax. Advised that prescription was sent to pharmacy on 09/03/2018 with #1 refill. Advised to contact pharmacy to fill.   States that he has already used refill as he is taking medication 3x daily as prescribed. Advised that he was to only take Xanax as needed, not scheduled 3x daily.   States that episodes have greatly reduced while taking medication 3x daily. Requested increased quantity to last him 30 days.   MD please advise.

## 2018-12-17 ENCOUNTER — Encounter: Payer: BC Managed Care – PPO | Admitting: Family Medicine

## 2019-01-15 ENCOUNTER — Other Ambulatory Visit: Payer: Self-pay

## 2019-01-15 ENCOUNTER — Other Ambulatory Visit: Payer: Self-pay | Admitting: Family Medicine

## 2019-01-15 DIAGNOSIS — Z20822 Contact with and (suspected) exposure to covid-19: Secondary | ICD-10-CM

## 2019-01-15 NOTE — Telephone Encounter (Signed)
Ok to refill??  Last office visit 09/03/2018.  Last refill 10/07/2018, #1 refill.

## 2019-01-16 LAB — NOVEL CORONAVIRUS, NAA: SARS-CoV-2, NAA: DETECTED — AB

## 2019-03-25 ENCOUNTER — Other Ambulatory Visit: Payer: Self-pay | Admitting: Family Medicine

## 2019-03-31 ENCOUNTER — Other Ambulatory Visit: Payer: Self-pay | Admitting: Family Medicine

## 2019-04-01 NOTE — Telephone Encounter (Signed)
Ok to refill??  Last office visit 09/03/2018.  Last refill 01/15/2019, #2 refills.

## 2019-04-23 ENCOUNTER — Other Ambulatory Visit: Payer: Self-pay | Admitting: Family Medicine

## 2019-04-23 NOTE — Telephone Encounter (Signed)
Ok to refill??  Last office visit 09/03/2018.  Last refill 04/01/2019.

## 2019-05-18 ENCOUNTER — Other Ambulatory Visit: Payer: Self-pay | Admitting: Family Medicine

## 2019-05-19 MED ORDER — ALPRAZOLAM 0.5 MG PO TABS: 0.5000 mg | ORAL_TABLET | Freq: Three times a day (TID) | ORAL | 3 refills | Status: DC | PRN

## 2019-05-19 NOTE — Telephone Encounter (Signed)
Please re-send as transmission to pharmacy failed.  °

## 2019-05-19 NOTE — Addendum Note (Signed)
Addended by: Sheral Flow on: 05/19/2019 11:45 AM   Modules accepted: Orders

## 2019-05-19 NOTE — Telephone Encounter (Signed)
Ok to refill??  Last office visit 09/03/2018.  Last refill 04/23/2019.  Ok to add refills to prescription?

## 2019-05-23 ENCOUNTER — Ambulatory Visit (INDEPENDENT_AMBULATORY_CARE_PROVIDER_SITE_OTHER): Payer: BC Managed Care – PPO | Admitting: Family Medicine

## 2019-05-23 ENCOUNTER — Encounter: Payer: Self-pay | Admitting: Family Medicine

## 2019-05-23 ENCOUNTER — Other Ambulatory Visit: Payer: Self-pay

## 2019-05-23 VITALS — BP 138/82 | HR 82 | Temp 98.1°F | Resp 14 | Ht 71.0 in | Wt 279.0 lb

## 2019-05-23 DIAGNOSIS — I10 Essential (primary) hypertension: Secondary | ICD-10-CM

## 2019-05-23 DIAGNOSIS — F410 Panic disorder [episodic paroxysmal anxiety] without agoraphobia: Secondary | ICD-10-CM

## 2019-05-23 DIAGNOSIS — Z6835 Body mass index (BMI) 35.0-35.9, adult: Secondary | ICD-10-CM

## 2019-05-23 DIAGNOSIS — F411 Generalized anxiety disorder: Secondary | ICD-10-CM | POA: Diagnosis not present

## 2019-05-23 MED ORDER — FLUOXETINE HCL 40 MG PO CAPS: 40.0000 mg | ORAL_CAPSULE | Freq: Every day | ORAL | 6 refills | Status: DC

## 2019-05-23 NOTE — Assessment & Plan Note (Signed)
Well controlled 

## 2019-05-23 NOTE — Patient Instructions (Signed)
F/U 4 weeks - afternoon appt  Prozac increased to  40mg 
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
## 2019-05-23 NOTE — Assessment & Plan Note (Addendum)
He is in a very anxiety prevents take for the past few years secondary to waiting for legal action to be decided. Prozac increased to 40mg  Continue trazodone 25mg  at bedtime prn Xanax prn F/U 4 weeks for medication evaluation

## 2019-05-29 ENCOUNTER — Other Ambulatory Visit: Payer: Self-pay | Admitting: Family Medicine

## 2019-06-02 ENCOUNTER — Other Ambulatory Visit: Payer: Self-pay | Admitting: Family Medicine

## 2019-06-23 ENCOUNTER — Telehealth (INDEPENDENT_AMBULATORY_CARE_PROVIDER_SITE_OTHER): Payer: BC Managed Care – PPO | Admitting: Family Medicine

## 2019-06-23 ENCOUNTER — Encounter: Payer: Self-pay | Admitting: Family Medicine

## 2019-06-23 DIAGNOSIS — F410 Panic disorder [episodic paroxysmal anxiety] without agoraphobia: Secondary | ICD-10-CM

## 2019-06-23 DIAGNOSIS — F411 Generalized anxiety disorder: Secondary | ICD-10-CM | POA: Diagnosis not present

## 2019-06-23 MED ORDER — TRAZODONE HCL 50 MG PO TABS: ORAL_TABLET | ORAL | 3 refills | Status: DC

## 2019-06-23 MED ORDER — FLUOXETINE HCL 40 MG PO CAPS: 40.0000 mg | ORAL_CAPSULE | Freq: Every day | ORAL | 6 refills | Status: DC

## 2019-06-23 NOTE — Progress Notes (Signed)
Virtual Visit via Video Note  I connected with Anthony Sharp on 06/23/19 at 4:10pm  by a video enabled telemedicine application and verified that I am speaking with the correct person using two identifiers.      Pt location: at home   Physician location:  In office, [facility], Vic Blackbird MD     On call: patient and physician   I discussed the limitations of evaluation and management by telemedicine and the availability of in person appointments. The patient expressed understanding and agreed to proceed.  History of Present Illness: Telehealth visit to follow-up medications.  At her last visit 1 month ago his Prozac was increased to 40 mg to help with his anxiety attacks.  He has noticed a significant difference with regards to his anxiety.  He only had 1 severe episode which was last week where all of a sudden he became diaphoretic and had palpitations.  He took an alprazolam when outside on his porch for about 15 to 20 minutes until the episode resolved.  Otherwise he has felt much better at the higher dose.  He sleep is good taking trazodone half a tablet at bedtime along with his Prozac.  He is not having side effects with the medication.  Regarding his nutrition he has been trying to decrease the carbs and sugars in his diet.   Observations/Objective: No acute distress noted sitting on his couch in his home.  Normal affect and mood normal speech very polite well-groomed   Assessment and Plan: Generalized anxiety disorder with panic attacks.  Continue Prozac at 40 mg once a day.  For the insomnia component continue trazodone at 25 mg at bedtime and as needed alprazolam for panic attacks.  We will follow-up in 2 months and see how he is doing.  If for some reason he noticed more increased panic attacks over the next month or so he will call me before then so we can adjust his medication at that time.  Follow Up Instructions:    I discussed the assessment and  treatment plan with the patient. The patient was provided an opportunity to ask questions and all were answered. The patient agreed with the plan and demonstrated an understanding of the instructions.   The patient was advised to call back or seek an in-person evaluation if the symptoms worsen or if the condition fails to improve as anticipated.  I provided 12 minutes of non-face-to-face time during this encounter. End Time 4:22pm  Vic Blackbird, MD

## 2019-08-10 ENCOUNTER — Other Ambulatory Visit: Payer: Self-pay | Admitting: Family Medicine

## 2019-08-11 NOTE — Telephone Encounter (Signed)
Ok to refill??  Last office visit 06/23/2019.  Last refill 05/19/2019, #3 refills.

## 2019-09-08 ENCOUNTER — Other Ambulatory Visit: Payer: Self-pay | Admitting: Family Medicine

## 2019-09-08 NOTE — Telephone Encounter (Signed)
Ok to refill??  Last office visit 06/23/2019.  Last refill 08/11/2019.

## 2019-09-15 ENCOUNTER — Other Ambulatory Visit: Payer: Self-pay | Admitting: Family Medicine

## 2019-09-15 DIAGNOSIS — I10 Essential (primary) hypertension: Secondary | ICD-10-CM

## 2019-10-21 ENCOUNTER — Other Ambulatory Visit: Payer: Self-pay | Admitting: Family Medicine

## 2019-10-22 NOTE — Telephone Encounter (Signed)
Ok to refill??  Last office visit 06/23/2019.  Last refill 09/08/2019, #1 refill.

## 2019-11-25 ENCOUNTER — Other Ambulatory Visit: Payer: Self-pay

## 2019-11-25 DIAGNOSIS — Z20822 Contact with and (suspected) exposure to covid-19: Secondary | ICD-10-CM

## 2019-11-26 LAB — NOVEL CORONAVIRUS, NAA: SARS-CoV-2, NAA: NOT DETECTED

## 2019-12-01 ENCOUNTER — Other Ambulatory Visit: Payer: Self-pay | Admitting: Family Medicine

## 2019-12-01 NOTE — Telephone Encounter (Signed)
Ok to refill??  Last office visit 06/23/2019.  Last refill 10/22/2019.

## 2019-12-02 ENCOUNTER — Other Ambulatory Visit: Payer: Self-pay

## 2019-12-04 ENCOUNTER — Other Ambulatory Visit: Payer: BC Managed Care – PPO

## 2019-12-04 ENCOUNTER — Other Ambulatory Visit: Payer: Self-pay

## 2019-12-04 ENCOUNTER — Other Ambulatory Visit: Payer: Self-pay | Admitting: *Deleted

## 2019-12-04 DIAGNOSIS — Z20822 Contact with and (suspected) exposure to covid-19: Secondary | ICD-10-CM

## 2019-12-06 LAB — NOVEL CORONAVIRUS, NAA: SARS-CoV-2, NAA: NOT DETECTED

## 2019-12-06 LAB — SPECIMEN STATUS REPORT

## 2019-12-06 LAB — SARS-COV-2, NAA 2 DAY TAT

## 2019-12-09 ENCOUNTER — Other Ambulatory Visit: Payer: Self-pay

## 2019-12-10 ENCOUNTER — Other Ambulatory Visit: Payer: BC Managed Care – PPO

## 2019-12-10 DIAGNOSIS — Z20822 Contact with and (suspected) exposure to covid-19: Secondary | ICD-10-CM

## 2019-12-12 LAB — SARS-COV-2, NAA 2 DAY TAT

## 2019-12-12 LAB — NOVEL CORONAVIRUS, NAA: SARS-CoV-2, NAA: NOT DETECTED

## 2019-12-19 ENCOUNTER — Other Ambulatory Visit: Payer: Self-pay | Admitting: Family Medicine

## 2019-12-19 DIAGNOSIS — I10 Essential (primary) hypertension: Secondary | ICD-10-CM

## 2020-01-12 ENCOUNTER — Other Ambulatory Visit: Payer: Self-pay | Admitting: Family Medicine

## 2020-01-21 ENCOUNTER — Other Ambulatory Visit: Payer: Self-pay | Admitting: Family Medicine

## 2020-01-21 NOTE — Telephone Encounter (Signed)
Please Advise

## 2020-03-22 ENCOUNTER — Other Ambulatory Visit: Payer: Self-pay | Admitting: Family Medicine

## 2020-04-06 ENCOUNTER — Other Ambulatory Visit: Payer: Self-pay | Admitting: Family Medicine

## 2020-04-06 DIAGNOSIS — I10 Essential (primary) hypertension: Secondary | ICD-10-CM

## 2020-05-17 ENCOUNTER — Other Ambulatory Visit: Payer: Self-pay | Admitting: Family Medicine

## 2020-06-24 ENCOUNTER — Other Ambulatory Visit: Payer: Self-pay | Admitting: Family Medicine

## 2020-06-24 NOTE — Telephone Encounter (Signed)
Ok to refill??  Last office visit 06/23/2019.  Last refill 05/17/2020.

## 2021-01-31 ENCOUNTER — Other Ambulatory Visit: Payer: Self-pay

## 2021-01-31 ENCOUNTER — Encounter: Payer: Self-pay | Admitting: Family Medicine

## 2021-01-31 ENCOUNTER — Ambulatory Visit (INDEPENDENT_AMBULATORY_CARE_PROVIDER_SITE_OTHER): Payer: BC Managed Care – PPO | Admitting: Family Medicine

## 2021-01-31 VITALS — BP 134/90 | HR 81 | Temp 97.9°F | Ht 71.75 in | Wt 270.8 lb

## 2021-01-31 DIAGNOSIS — I10 Essential (primary) hypertension: Secondary | ICD-10-CM | POA: Diagnosis not present

## 2021-01-31 DIAGNOSIS — Z0001 Encounter for general adult medical examination with abnormal findings: Secondary | ICD-10-CM

## 2021-01-31 DIAGNOSIS — Z0000 Encounter for general adult medical examination without abnormal findings: Secondary | ICD-10-CM

## 2021-01-31 DIAGNOSIS — E669 Obesity, unspecified: Secondary | ICD-10-CM

## 2021-01-31 MED ORDER — CHLORTHALIDONE 15 MG PO TABS: 15.0000 mg | ORAL_TABLET | Freq: Every day | ORAL | 0 refills | Status: DC

## 2021-01-31 NOTE — Patient Instructions (Signed)

## 2021-01-31 NOTE — Progress Notes (Signed)
Office Note 01/31/2021  CC:  Chief Complaint  Patient presents with   Establish Care    HPI:  Anthony Sharp is a 30 y.o. White male who is here to establish care.  Old records were reviewed prior to or during today's visit.  Anthony Sharp feels well. Has been taking blood pressure medicine for years.  Recalls being on metoprolol at 1 point as well as HCTZ at 1 point but he does not recall why he is not on these currently.  He does not recall any adverse reaction to any med in the past.  Has been on amlodipine 10 mg a day for at least the last 1 to 2 years.  He checks blood pressure pressure fairly regularly at home and gets 130s over 80s most of the time.  Recalls a period of a couple days of a headache a few weeks ago and checked blood pressure and was 180/110.  He drank water and rested and that evening it was back down to 130s over 80s.  He has smoked cigarettes for years and was traditionally a 1 and half pack day smoker and now is down to about half to three quarters a pack a day. Diet fair, exercise---no formal exercise but works physically strenuous work with [employer].  PMP AWARE reviewed today: most recent rx for alprazolam was filled 06/25/20, # 20, rx by Anthony Sharp at [facility] practice. No red flags.   Past Medical History:  Diagnosis Date   Anxiety    Hypertension    Obesity, Class II, BMI 35-39.9     Past Surgical History:  Procedure Laterality Date   BACK SURGERY  2018   L5, S1    Family History  Problem Relation Age of Onset   Hypertension Father    Hypertension Brother     Social History   Socioeconomic History   Marital status: Single    Spouse name: Not on file   Number of children: Not on file   Years of education: Not on file   Highest education level: Not on file  Occupational History   Not on file  Tobacco Use   Smoking status: Every Day    Packs/day: 1.00    Types: Cigarettes   Smokeless tobacco: Current    Types: Snuff   Substance and Sexual Activity   Alcohol use: Yes    Comment: socially   Drug use: No   Sexual activity: Not on file  Other Topics Concern   Not on file  Social History Narrative   Single, grew up [place].   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     Alc: occasional   Tob: smoker, cutting back as of 2022.   Drugs: none   Social Determinants of Health   Financial Resource Strain: Not on file  Food Insecurity: Not on file  Transportation Needs: Not on file  Physical Activity: Not on file  Stress: Not on file  Social Connections: Not on file  Intimate Partner Violence: Not on file    Outpatient Encounter Medications as of 01/31/2021  Medication Sig   amLODipine (NORVASC) 10 MG tablet Take 1 tablet by mouth once daily   chlorthalidone (HYGROTEN) 15 MG tablet Take 1 tablet (15 mg total) by mouth daily.   ibuprofen (ADVIL,MOTRIN) 200 MG tablet Take 200 mg by mouth every 6 (six) hours as needed for headache, mild pain or moderate pain.   [DISCONTINUED] acetaminophen (TYLENOL) 500 MG tablet Take 500 mg by  mouth every 6 (six) hours as needed for mild pain or moderate pain. (Patient not taking: Reported on 01/31/2021)   [DISCONTINUED] ALPRAZolam (XANAX) 0.5 MG tablet Take 1 tablet (0.5 mg total) by mouth 3 (three) times daily as needed for anxiety. This is a courtesy refill.  No further refills will be given by our office.  Needs to establish care with new PCP for further refills. (Patient not taking: Reported on 01/31/2021)   [DISCONTINUED] Cetirizine HCl (ZYRTEC ALLERGY) 10 MG CAPS Take 1 capsule (10 mg total) by mouth daily as needed. (Patient not taking: No sig reported)   [DISCONTINUED] FLUoxetine (PROZAC) 40 MG capsule Take 1 capsule by mouth once daily (Patient not taking: Reported on 01/31/2021)   [DISCONTINUED] traZODone (DESYREL) 50 MG tablet TAKE 1/2 TO 1 (ONE-HALF TO ONE) TABLET BY MOUTH ONCE DAILY AT BEDTIME AS NEEDED FOR SLEEP (Patient not taking: Reported on  01/31/2021)   No facility-administered encounter medications on file as of 01/31/2021.    Allergies  Allergen Reactions   Bee Venom Swelling   Oxycodone Itching    ROS Review of Systems  Constitutional:  Negative for appetite change, chills, fatigue and fever.  HENT:  Negative for congestion, dental problem, ear pain and sore throat.   Eyes:  Negative for discharge, redness and visual disturbance.  Respiratory:  Negative for cough, chest tightness, shortness of breath and wheezing.   Cardiovascular:  Negative for chest pain, palpitations and leg swelling.  Gastrointestinal:  Negative for abdominal pain, blood in stool, diarrhea, nausea and vomiting.  Genitourinary:  Negative for difficulty urinating, dysuria, flank pain, frequency, hematuria and urgency.  Musculoskeletal:  Negative for arthralgias, back pain, joint swelling, myalgias and neck stiffness.  Skin:  Negative for pallor and rash.  Neurological:  Negative for dizziness, speech difficulty, weakness and headaches.  Hematological:  Negative for adenopathy. Does not bruise/bleed easily.  Psychiatric/Behavioral:  Negative for confusion and sleep disturbance. The patient is not nervous/anxious.    PE; Blood pressure 134/90, pulse 81, temperature 97.9 F (36.6 C), temperature source Oral, height 5' 11.75" (1.822 m), weight 270 lb 12.8 oz (122.8 kg), SpO2 97 %. Gen: Alert, well appearing.  Patient is oriented to person, place, time, and situation. AFFECT: pleasant, lucid thought and speech. ENT: Ears: EACs clear, normal epithelium.  TMs with good light reflex and landmarks bilaterally.  Eyes: no injection, icteris, swelling, or exudate.  EOMI, PERRLA. Nose: no drainage or turbinate edema/swelling.  No injection or focal lesion.  Mouth: lips without lesion/swelling.  Oral mucosa pink and moist.  Dentition intact and without obvious caries or gingival swelling.  Oropharynx without erythema, exudate, or swelling.  Neck:  supple/nontender.  No LAD, mass, or TM.  Carotid pulses 2+ bilaterally, without bruits. CV: RRR, no m/r/g.   LUNGS: CTA bilat, nonlabored resps, good aeration in all lung fields. ABD: soft, NT, ND, BS normal.  No hepatospenomegaly or mass.  No bruits. EXT: no clubbing, cyanosis, or edema.  Musculoskeletal: no joint swelling, erythema, warmth, or tenderness.  ROM of all joints intact. Skin - no sores or suspicious lesions or rashes or color changes  Pertinent labs:  Lab Results  Component Value Date   TSH 0.90 09/13/2016   Lab Results  Component Value Date   WBC 6.4 09/03/2018   HGB 17.8 (H) 09/03/2018   HCT 51.6 (H) 09/03/2018   MCV 93.1 09/03/2018   PLT 190 09/03/2018   Lab Results  Component Value Date   CREATININE 1.06 09/03/2018   BUN  13 09/03/2018   NA 138 09/03/2018   K 4.6 09/03/2018   CL 102 09/03/2018   CO2 26 09/03/2018   Lab Results  Component Value Date   ALT 21 09/03/2018   AST 17 09/03/2018   ALKPHOS 52 09/13/2016   BILITOT 0.7 09/03/2018   Lab Results  Component Value Date   CHOL 186 09/03/2018   Lab Results  Component Value Date   HDL 56 09/03/2018   Lab Results  Component Value Date   LDLCALC 97 09/03/2018   Lab Results  Component Value Date   TRIG 213 (H) 09/03/2018   Lab Results  Component Value Date   CHOLHDL 3.3 09/03/2018    ASSESSMENT AND PLAN:   New patient, establishing care.  1.  Uncontrolled hypertension.  Add chlorthalidone 15 mg a day.  Continue amlodipine 10 mg a day.  Monitor blood pressure and heart rate daily for the next 2 weeks and bring these back for review with me.  Electrolytes and creatinine checked today. Encouraged smoking cessation as well as increase exercise.  2) Health maintenance exam: Reviewed age and gender appropriate health maintenance issues (prudent diet, regular exercise, health risks of tobacco and excessive alcohol, use of seatbelts, fire alarms in home, use of sunscreen).  Also reviewed age and  gender appropriate health screening as well as vaccine recommendations. Vaccines : Tdap today.  He declined flu vaccine today. Labs: fasting HP labs today.  An After Visit Summary was printed and given to the patient.  Return in about 2 weeks (around 02/14/2021) for f/u HTN.  Signed:  Crissie Sickles, MD           01/31/2021

## 2021-02-01 LAB — LIPID PANEL
Cholesterol: 220 mg/dL — ABNORMAL HIGH (ref 0–200)
HDL: 54.5 mg/dL (ref >39.00)
LDL Cholesterol: 128 mg/dL — ABNORMAL HIGH (ref 0–99)
NonHDL: 165.42
Total CHOL/HDL Ratio: 4
Triglycerides: 188 mg/dL — ABNORMAL HIGH (ref 0.0–149.0)
VLDL: 37.6 mg/dL (ref 0.0–40.0)

## 2021-02-01 LAB — COMPREHENSIVE METABOLIC PANEL
ALT: 37 U/L (ref 0–53)
AST: 22 U/L (ref 0–37)
Albumin: 4.9 g/dL (ref 3.5–5.2)
Alkaline Phosphatase: 51 U/L (ref 39–117)
BUN: 12 mg/dL (ref 6–23)
CO2: 25 mEq/L (ref 19–32)
Calcium: 9.7 mg/dL (ref 8.4–10.5)
Chloride: 100 mEq/L (ref 96–112)
Creatinine, Ser: 0.98 mg/dL (ref 0.40–1.50)
GFR: 102.15 mL/min (ref >60.00)
Glucose, Bld: 100 mg/dL — ABNORMAL HIGH (ref 70–99)
Potassium: 3.9 mEq/L (ref 3.5–5.1)
Sodium: 136 mEq/L (ref 135–145)
Total Bilirubin: 0.9 mg/dL (ref 0.2–1.2)
Total Protein: 7.6 g/dL (ref 6.0–8.3)

## 2021-02-01 LAB — CBC WITH DIFFERENTIAL/PLATELET
Basophils Absolute: 0.1 10*3/uL (ref 0.0–0.1)
Basophils Relative: 1.3 % (ref 0.0–3.0)
Eosinophils Absolute: 0.5 10*3/uL (ref 0.0–0.7)
Eosinophils Relative: 6.2 % — ABNORMAL HIGH (ref 0.0–5.0)
HCT: 51.9 % (ref 39.0–52.0)
Hemoglobin: 17.7 g/dL — ABNORMAL HIGH (ref 13.0–17.0)
Lymphocytes Relative: 26.8 % (ref 12.0–46.0)
Lymphs Abs: 2.4 10*3/uL (ref 0.7–4.0)
MCHC: 34.1 g/dL (ref 30.0–36.0)
MCV: 89 fl (ref 78.0–100.0)
Monocytes Absolute: 0.6 10*3/uL (ref 0.1–1.0)
Monocytes Relative: 6.9 % (ref 3.0–12.0)
Neutro Abs: 5.2 10*3/uL (ref 1.4–7.7)
Neutrophils Relative %: 58.8 % (ref 43.0–77.0)
Platelets: 232 10*3/uL (ref 150.0–400.0)
RBC: 5.83 Mil/uL — ABNORMAL HIGH (ref 4.22–5.81)
RDW: 12.6 % (ref 11.5–15.5)
WBC: 8.8 10*3/uL (ref 4.0–10.5)

## 2021-02-01 LAB — TSH: TSH: 1.93 u[IU]/mL (ref 0.35–5.50)

## 2021-02-02 ENCOUNTER — Encounter: Payer: Self-pay | Admitting: Family Medicine

## 2021-02-02 ENCOUNTER — Telehealth: Payer: Self-pay

## 2021-02-02 ENCOUNTER — Other Ambulatory Visit (INDEPENDENT_AMBULATORY_CARE_PROVIDER_SITE_OTHER): Payer: BC Managed Care – PPO

## 2021-02-02 DIAGNOSIS — D751 Secondary polycythemia: Secondary | ICD-10-CM

## 2021-02-02 LAB — IBC + FERRITIN
Ferritin: 197.3 ng/mL (ref 22.0–322.0)
Iron: 191 ug/dL — ABNORMAL HIGH (ref 42–165)
Saturation Ratios: 51.3 % — ABNORMAL HIGH (ref 20.0–50.0)
TIBC: 372.4 ug/dL (ref 250.0–450.0)
Transferrin: 266 mg/dL (ref 212.0–360.0)

## 2021-02-02 NOTE — Telephone Encounter (Signed)
-----   Message from Tammi Sou, MD sent at 02/02/2021  8:00 AM EST ----- Pls add a quest iron panel to this. Dx erythrocytosis.

## 2021-02-03 ENCOUNTER — Other Ambulatory Visit: Payer: Self-pay | Admitting: Family Medicine

## 2021-02-03 DIAGNOSIS — D751 Secondary polycythemia: Secondary | ICD-10-CM

## 2021-02-03 NOTE — Telephone Encounter (Signed)
OK, I ordered referral

## 2021-02-03 NOTE — Addendum Note (Signed)
Addended by: Deveron Furlong D on: 02/03/2021 02:46 PM   Modules accepted: Orders

## 2021-02-03 NOTE — Telephone Encounter (Signed)
Pt returned call for lab results Call back # [phone]

## 2021-02-03 NOTE — Telephone Encounter (Signed)
Pt was advised someone would reach out to schedule from hematology office.

## 2021-02-03 NOTE — Telephone Encounter (Signed)
Emilee Hero, [place]  02/03/2021  2:44 PM EST Back to Top    Spoke with pt regarding results/recommendations,voiced understanding. Agreeable to referral, referral pending.    Emilee Hero, [place]  02/03/2021  9:34 AM EST     Tried calling patient, unable to LVM on cell. Called work number and spoke with Butch Penny, left message to have pt call back. He will not be available until 3pm.   Tammi Sou, MD  02/02/2021  5:58 PM EST     I tried calling pt but got message stating VM not set up yet. Can you see if there is another # for him or send mychart message:  all labs great except red blood cell count elevated.  This can happen from smoking but also from elevated iron.  His iron is slightly high and I recommend he see a blood specialist (hematologist) to confirm diagnosis and for recommendations of treatment.  Let me know and I'll enter referral order.

## 2021-02-14 ENCOUNTER — Other Ambulatory Visit: Payer: Self-pay

## 2021-02-14 ENCOUNTER — Ambulatory Visit: Payer: BC Managed Care – PPO | Admitting: Family Medicine

## 2021-02-14 ENCOUNTER — Encounter: Payer: Self-pay | Admitting: Family Medicine

## 2021-02-14 VITALS — BP 137/81 | HR 100 | Temp 98.3°F | Wt 267.0 lb

## 2021-02-14 DIAGNOSIS — T50905A Adverse effect of unspecified drugs, medicaments and biological substances, initial encounter: Secondary | ICD-10-CM

## 2021-02-14 DIAGNOSIS — I10 Essential (primary) hypertension: Secondary | ICD-10-CM | POA: Diagnosis not present

## 2021-02-14 MED ORDER — IRBESARTAN 75 MG PO TABS: ORAL_TABLET | ORAL | 0 refills | Status: DC

## 2021-02-14 NOTE — Patient Instructions (Addendum)
Hematology contact # [phone]  Stop chlorthalidone.  Take 1 irbesartan tab daily for 5d. If bp not consistently <130/80 then increase to 2 tabs daily.  Continue amlodipine as-is.

## 2021-02-14 NOTE — Progress Notes (Addendum)
OFFICE VISIT  02/14/2021  CC:  Chief Complaint  Patient presents with   Hypertension    2 wk f/u. HTN. Pt c/o trouble sleeping with ringing in ears since starting chlorthalidone.    HPI:    Patient is a 30 y.o. male who presents for 2 wk f/u HTN. Since starting chlorthalidone, he has experienced insomnia and tinnitus. He reports insomnia so severe the he sometimes only gets one hour of sleep. He feels his tinnitus is not a big issue. ROS negative (headache, vision changes, swelling, dizziness) Has checked home bp's and 130s/80s to best of his recollection.  Past Medical History:  Diagnosis Date   Anxiety    Erythrocytosis    elev transferrin sat   Hypertension    Obesity, Class II, BMI 35-39.9     Past Surgical History:  Procedure Laterality Date   BACK SURGERY  2018   L5, S1    Outpatient Medications Prior to Visit  Medication Sig Dispense Refill   amLODipine (NORVASC) 10 MG tablet Take 1 tablet by mouth once daily 90 tablet 3   chlorthalidone (HYGROTEN) 15 MG tablet Take 1 tablet (15 mg total) by mouth daily. 30 tablet 0   ibuprofen (ADVIL,MOTRIN) 200 MG tablet Take 200 mg by mouth every 6 (six) hours as needed for headache, mild pain or moderate pain.     No facility-administered medications prior to visit.    Allergies  Allergen Reactions   Bee Venom Swelling   Oxycodone Itching    ROS As per HPI  PE: Vitals with BMI 02/14/2021 01/31/2021 05/23/2019  Height - 5' 11.75" 5\' 11"   Weight 267 lbs 270 lbs 13 oz 279 lbs  BMI 54.09 37 81.19  Systolic 147 829 562  Diastolic 81 90 82  Pulse 130 81 82   Physical Exam Constitutional:      Appearance: Normal appearance.  Cardiovascular:     Rate and Rhythm: Normal rate and regular rhythm.     Heart sounds: Normal heart sounds.  Pulmonary:     Effort: Pulmonary effort is normal.     Breath sounds: Normal breath sounds.  Neurological:     Mental Status: He is alert.  Psychiatric:        Mood and Affect: Mood  normal.        Behavior: Behavior normal.     LABS:  Lab Results  Component Value Date   TSH 1.93 01/31/2021   Lab Results  Component Value Date   WBC 8.8 01/31/2021   HGB 17.7 (H) 01/31/2021   HCT 51.9 01/31/2021   MCV 89.0 01/31/2021   PLT 232.0 01/31/2021   Lab Results  Component Value Date   IRON 191 (H) 02/02/2021   TIBC 372.4 02/02/2021   FERRITIN 197.3 02/02/2021   Lab Results  Component Value Date   CREATININE 0.98 01/31/2021   BUN 12 01/31/2021   NA 136 01/31/2021   K 3.9 01/31/2021   CL 100 01/31/2021   CO2 25 01/31/2021   Lab Results  Component Value Date   ALT 37 01/31/2021   AST 22 01/31/2021   ALKPHOS 51 01/31/2021   BILITOT 0.9 01/31/2021   Lab Results  Component Value Date   CHOL 220 (H) 01/31/2021   Lab Results  Component Value Date   HDL 54.50 01/31/2021   Lab Results  Component Value Date   LDLCALC 128 (H) 01/31/2021   Lab Results  Component Value Date   TRIG 188.0 (H) 01/31/2021   Lab Results  Component Value Date   CHOLHDL 4 01/31/2021     IMPRESSION AND PLAN:  Non-toxic appearing male who presents for follow up for uncontrolled HTN.   1. Hypertension, uncontrolled. BP was 137/81 today. Will remove chlorthalidone due to significant impact and side effects (tinnitus, insomnia). Will replace medication with Irbesartan 75 mg daily. Patient instructed to increase to 2 tabs in five days in BP isn't desired goal. Continue amlodipine 10 mg a day.  Encouraged the patient to continue at home BP monitoring. Electrolytes and creatinine checked will be checked again today. Encouraged smoking cessation as well as continuing exercise. Will reassess need to treat insomnia or tinnitus at next visit if problems persist.  An After Visit Summary was printed and given to the patient.  FOLLOW UP:   Two weeks  Phil Dopp - MS3  I personally was present during the history, physical exam, and medical decision-making activities of this service  and have verified that the service and findings are accurately documented in the student's note.  Signed:  Crissie Sickles, MD           02/14/2021

## 2021-02-14 NOTE — Progress Notes (Signed)
See student note for this encounter. I have attested it. Signed:  Crissie Sickles, MD           02/14/2021

## 2021-02-15 LAB — BASIC METABOLIC PANEL
BUN: 17 mg/dL (ref 6–23)
CO2: 25 mEq/L (ref 19–32)
Calcium: 9.6 mg/dL (ref 8.4–10.5)
Chloride: 103 mEq/L (ref 96–112)
Creatinine, Ser: 1.24 mg/dL (ref 0.40–1.50)
GFR: 77 mL/min (ref >60.00)
Glucose, Bld: 163 mg/dL — ABNORMAL HIGH (ref 70–99)
Potassium: 3.7 mEq/L (ref 3.5–5.1)
Sodium: 138 mEq/L (ref 135–145)

## 2021-02-21 ENCOUNTER — Telehealth: Payer: Self-pay

## 2021-02-21 DIAGNOSIS — D751 Secondary polycythemia: Secondary | ICD-10-CM

## 2021-02-21 NOTE — Telephone Encounter (Signed)
Emilee Hero, [place]  02/21/2021  2:46 PM EST     Pt was not fasting, verified.  New referral entered

## 2021-02-21 NOTE — Telephone Encounter (Signed)
-----   Message from Tammi Sou, MD sent at 02/15/2021  8:38 PM EST ----- All labs normal except glucose 163. This sugar level is fine if he was not fasting. Please clarify with patient.

## 2021-02-28 ENCOUNTER — Encounter: Payer: Self-pay | Admitting: Family Medicine

## 2021-02-28 ENCOUNTER — Other Ambulatory Visit: Payer: Self-pay

## 2021-02-28 ENCOUNTER — Ambulatory Visit: Payer: BC Managed Care – PPO | Admitting: Family Medicine

## 2021-02-28 VITALS — BP 119/77 | HR 93 | Temp 98.1°F | Ht 71.75 in | Wt 271.8 lb

## 2021-02-28 DIAGNOSIS — I10 Essential (primary) hypertension: Secondary | ICD-10-CM

## 2021-02-28 MED ORDER — IRBESARTAN 75 MG PO TABS: ORAL_TABLET | ORAL | 3 refills | Status: DC

## 2021-02-28 NOTE — Progress Notes (Signed)
OFFICE VISIT  02/28/2021  CC:  Chief Complaint  Patient presents with   Follow-up    Hypertension, pt is not fasting    HPI:    Patient is a 30 y.o. male who presents for 2 week f/u HTN. A/P as of last visit: "Hypertension, uncontrolled. BP was 137/81 today. Will remove chlorthalidone due to significant impact and side effects (tinnitus, insomnia). Will replace medication with Irbesartan 75 mg daily. Patient instructed to increase to 2 tabs in five days in BP isn't desired goal. Continue amlodipine 10 mg a day.  Encouraged the patient to continue at home BP monitoring. Electrolytes and creatinine checked will be checked again today. Encouraged smoking cessation as well as continuing exercise. Will reassess need to treat insomnia or tinnitus at next visit if problems persist."  INTERIM HX: Labs last visit all normal.   Past Medical History:  Diagnosis Date   Anxiety    Erythrocytosis    elev transferrin sat   Hypertension    Obesity, Class II, BMI 35-39.9     Past Surgical History:  Procedure Laterality Date   BACK SURGERY  2018   L5, S1    Outpatient Medications Prior to Visit  Medication Sig Dispense Refill   amLODipine (NORVASC) 10 MG tablet Take 1 tablet by mouth once daily 90 tablet 3   ibuprofen (ADVIL,MOTRIN) 200 MG tablet Take 200 mg by mouth every 6 (six) hours as needed for headache, mild pain or moderate pain.     irbesartan (AVAPRO) 75 MG tablet 1 tab po qd 30 tablet 0   No facility-administered medications prior to visit.    Allergies  Allergen Reactions   Bee Venom Swelling   Oxycodone Itching   Chlorthalidone Other (See Comments)    Tinnitus and insomnia    ROS As per HPI  PE: Vitals with BMI 02/28/2021 02/14/2021 01/31/2021  Height 5' 11.75" - 5' 11.75"  Weight 271 lbs 13 oz 267 lbs 270 lbs 13 oz  BMI 40.10 27.25 37  Systolic 366 440 347  Diastolic 77 81 90  Pulse 93 100 81      LABS:    Chemistry      Component Value Date/Time    NA 138 02/14/2021 1439   K 3.7 02/14/2021 1439   CL 103 02/14/2021 1439   CO2 25 02/14/2021 1439   BUN 17 02/14/2021 1439   CREATININE 1.24 02/14/2021 1439   CREATININE 1.06 09/03/2018 0831      Component Value Date/Time   CALCIUM 9.6 02/14/2021 1439   ALKPHOS 51 01/31/2021 1425   AST 22 01/31/2021 1425   ALT 37 01/31/2021 1425   BILITOT 0.9 01/31/2021 1425      IMPRESSION AND PLAN:  No problem-specific Assessment & Plan notes found for this encounter.   An After Visit Summary was printed and given to the patient.  FOLLOW UP: No follow-ups on file.  Signed:  Crissie Sickles, MD           02/28/2021

## 2021-02-28 NOTE — Progress Notes (Addendum)
OFFICE VISIT  02/28/2021  CC:  Chief Complaint  Patient presents with   Follow-up    Hypertension, pt is not fasting    HPI:    Patient is a 30 y.o. male who presents for 2 week f/u HTN. Overall patient feels great. He no longer hears the ringing in his ear and is now getting quality sleep. Home BP are all around 125/81. He is tolerating Irbesartan 75 mg daily along with Amlodipine 10 mg. ROS negative for dizziness, swelling of extremities, headaches, or change in vision.  INTERIM HX: Labs last visit all normal.   Past Medical History:  Diagnosis Date   Anxiety    Erythrocytosis    elev transferrin sat   Hypertension    Obesity, Class II, BMI 35-39.9     Past Surgical History:  Procedure Laterality Date   BACK SURGERY  2018   L5, S1    Outpatient Medications Prior to Visit  Medication Sig Dispense Refill   amLODipine (NORVASC) 10 MG tablet Take 1 tablet by mouth once daily 90 tablet 3   ibuprofen (ADVIL,MOTRIN) 200 MG tablet Take 200 mg by mouth every 6 (six) hours as needed for headache, mild pain or moderate pain.     irbesartan (AVAPRO) 75 MG tablet 1 tab po qd 30 tablet 0   No facility-administered medications prior to visit.    Allergies  Allergen Reactions   Bee Venom Swelling   Oxycodone Itching   Chlorthalidone Other (See Comments)    Tinnitus and insomnia    ROS As per HPI  PE: Vitals with BMI 02/28/2021 02/14/2021 01/31/2021  Height 5' 11.75" - 5' 11.75"  Weight 271 lbs 13 oz 267 lbs 270 lbs 13 oz  BMI 16.10 96.04 37  Systolic 540 981 191  Diastolic 77 81 90  Pulse 93 100 81   Physical Exam Constitutional:      Appearance: Normal appearance.  Cardiovascular:     Rate and Rhythm: Normal rate and regular rhythm.     Heart sounds: Normal heart sounds.  Pulmonary:     Effort: Pulmonary effort is normal.     Breath sounds: Normal breath sounds.  Neurological:     Mental Status: He is alert.    LABS:    Chemistry      Component Value  Date/Time   NA 138 02/14/2021 1439   K 3.7 02/14/2021 1439   CL 103 02/14/2021 1439   CO2 25 02/14/2021 1439   BUN 17 02/14/2021 1439   CREATININE 1.24 02/14/2021 1439   CREATININE 1.06 09/03/2018 0831      Component Value Date/Time   CALCIUM 9.6 02/14/2021 1439   ALKPHOS 51 01/31/2021 1425   AST 22 01/31/2021 1425   ALT 37 01/31/2021 1425   BILITOT 0.9 01/31/2021 1425      IMPRESSION AND PLAN: Non-toxic male who presents for HTN f/u.  Hypertension, controlled. BP is 119/77 today. Patient will continue Irbesartan 75 mg and Amlodipine 10 mg for BP control. Encouraged the patient to continue at home BP monitoring. Electrolytes and creatinine checked will be checked again today. Discussed smoking cessation and patient is okay to discuss it at his next physical exam.   An After Visit Summary was printed and given to the patient.  FOLLOW UP: Return in about 6 months (around 08/29/2021) for routine chronic illness f/u.  Phil Dopp -MS3  Signed:  Crissie Sickles, MD           02/28/2021

## 2021-03-01 LAB — BASIC METABOLIC PANEL
BUN: 16 mg/dL (ref 6–23)
CO2: 26 mEq/L (ref 19–32)
Calcium: 9.4 mg/dL (ref 8.4–10.5)
Chloride: 103 mEq/L (ref 96–112)
Creatinine, Ser: 1.13 mg/dL (ref 0.40–1.50)
GFR: 86.05 mL/min (ref >60.00)
Glucose, Bld: 118 mg/dL — ABNORMAL HIGH (ref 70–99)
Potassium: 4.3 mEq/L (ref 3.5–5.1)
Sodium: 137 mEq/L (ref 135–145)

## 2021-03-11 ENCOUNTER — Other Ambulatory Visit: Payer: Self-pay

## 2021-03-11 ENCOUNTER — Inpatient Hospital Stay: Payer: BC Managed Care – PPO | Admitting: Hematology & Oncology

## 2021-03-11 ENCOUNTER — Inpatient Hospital Stay: Payer: BC Managed Care – PPO | Attending: Hematology & Oncology

## 2021-03-11 ENCOUNTER — Encounter: Payer: Self-pay | Admitting: Hematology & Oncology

## 2021-03-11 DIAGNOSIS — F17210 Nicotine dependence, cigarettes, uncomplicated: Secondary | ICD-10-CM

## 2021-03-11 LAB — CMP (CANCER CENTER ONLY)
ALT: 36 U/L (ref 0–44)
AST: 19 U/L (ref 15–41)
Albumin: 4.5 g/dL (ref 3.5–5.0)
Alkaline Phosphatase: 52 U/L (ref 38–126)
Anion gap: 7 (ref 5–15)
BUN: 18 mg/dL (ref 6–20)
CO2: 27 mmol/L (ref 22–32)
Calcium: 10 mg/dL (ref 8.9–10.3)
Chloride: 104 mmol/L (ref 98–111)
Creatinine: 1 mg/dL (ref 0.61–1.24)
GFR, Estimated: >60 mL/min (ref >60)
Glucose, Bld: 81 mg/dL (ref 70–99)
Potassium: 4.1 mmol/L (ref 3.5–5.1)
Sodium: 138 mmol/L (ref 135–145)
Total Bilirubin: 0.6 mg/dL (ref 0.3–1.2)
Total Protein: 7.3 g/dL (ref 6.5–8.1)

## 2021-03-11 LAB — CBC WITH DIFFERENTIAL (CANCER CENTER ONLY)
Abs Immature Granulocytes: 0.06 10*3/uL (ref 0.00–0.07)
Basophils Absolute: 0.1 10*3/uL (ref 0.0–0.1)
Basophils Relative: 1 %
Eosinophils Absolute: 0.7 10*3/uL — ABNORMAL HIGH (ref 0.0–0.5)
Eosinophils Relative: 8 %
HCT: 51.1 % (ref 39.0–52.0)
Hemoglobin: 17.9 g/dL — ABNORMAL HIGH (ref 13.0–17.0)
Immature Granulocytes: 1 %
Lymphocytes Relative: 28 %
Lymphs Abs: 2.4 10*3/uL (ref 0.7–4.0)
MCH: 30.7 pg (ref 26.0–34.0)
MCHC: 35 g/dL (ref 30.0–36.0)
MCV: 87.7 fL (ref 80.0–100.0)
Monocytes Absolute: 0.8 10*3/uL (ref 0.1–1.0)
Monocytes Relative: 9 %
Neutro Abs: 4.8 10*3/uL (ref 1.7–7.7)
Neutrophils Relative %: 53 %
Platelet Count: 221 10*3/uL (ref 150–400)
RBC: 5.83 MIL/uL — ABNORMAL HIGH (ref 4.22–5.81)
RDW: 12.1 % (ref 11.5–15.5)
WBC Count: 8.9 10*3/uL (ref 4.0–10.5)
nRBC: 0 % (ref 0.0–0.2)

## 2021-03-11 LAB — LACTATE DEHYDROGENASE: LDH: 165 U/L (ref 98–192)

## 2021-03-11 LAB — SAVE SMEAR(SSMR), FOR PROVIDER SLIDE REVIEW

## 2021-03-11 NOTE — Progress Notes (Signed)
Referral MD  Reason for Referral: Erythrocytosis and possible iron excess  Chief Complaint  Patient presents with   New Patient (Initial Visit)  : I was told that I had too much iron.  HPI: Anthony Sharp is a really nice 30 year old white male.  He lives in [place].  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  He expects bridges.  He has been seen previously at [facility].  Unfortunately, his doctor there quit and he had to find a new doctor.  He did find a new doctor.  He found a very good doctor with Dr. Anitra Lauth.  Subsequently, he had lab work that was done as a routine.  He was not having any problems.  He does smoke about half a pack a day.  He does have some alcohol use.  There is no recreational drug use.  With his lab work, which was done in November, his white count was 8.8.  Hemoglobin 17.7.  Platelet count 232,000.  MCV was 89.  Apparently, iron studies were also done.  Once I saw were a iron saturation of 51% and a serum iron of 191.  Based on this, he was sent to the [facility] for an evaluation.  He has had no problems with nausea or vomiting.  He does not take any counter supplements.  He does not have any problems with bowels or bladder.  He has had no rashes.  He is not a vegetarian.  He has had no cough or shortness of breath.  Every probably has about a 20-pack-year history of tobacco use.  There is no history in the family of any kind of liver problems.  There is no obvious blood problem in the family.  He has had past surgery in the lumbar spine.  He had a herniated disc at L5-S1.  This is probably about 4 years ago.  I would have to say that overall, his performance status is ECOG 0.     Past Medical History:  Diagnosis Date   Anxiety    Erythrocytosis    elev transferrin sat   Hypertension    Obesity, Class II, BMI 35-39.9   :   Past Surgical History:  Procedure Laterality Date   BACK SURGERY  2018    L5, S1  :   Current Outpatient Medications:    amLODipine (NORVASC) 10 MG tablet, Take 1 tablet by mouth once daily, Disp: 90 tablet, Rfl: 3   ibuprofen (ADVIL,MOTRIN) 200 MG tablet, Take 200 mg by mouth every 6 (six) hours as needed for headache, mild pain or moderate pain., Disp: , Rfl:    irbesartan (AVAPRO) 75 MG tablet, 1 tab po qd, Disp: 90 tablet, Rfl: 3:  :   Allergies  Allergen Reactions   Bee Venom Swelling   Oxycodone Itching   Chlorthalidone Other (See Comments)    Tinnitus and insomnia  :   Family History  Problem Relation Age of Onset   Hypertension Father    Hypertension Brother   :   Social History   Socioeconomic History   Marital status: Single    Spouse name: Not on file   Number of children: Not on file   Years of education: Not on file   Highest education level: Not on file  Occupational History   Not on file  Tobacco Use   Smoking status: Every Day    Packs/day: 1.00    Types: Cigarettes   Smokeless tobacco:  Current    Types: Snuff  Substance and Sexual Activity   Alcohol use: Yes    Comment: socially   Drug use: No   Sexual activity: Not on file  Other Topics Concern   Not on file  Social History Narrative   Single, grew up [place].   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     Alc: occasional   Tob: smoker, cutting back as of 2022.   Drugs: none   Social Determinants of Radio broadcast assistant Strain: Not on file  Food Insecurity: Not on file  Transportation Needs: Not on file  Physical Activity: Not on file  Stress: Not on file  Social Connections: Not on file  Intimate Partner Violence: Not on file  :  Review of Systems  Constitutional: Negative.   HENT: Negative.    Eyes: Negative.   Respiratory: Negative.    Cardiovascular: Negative.   Gastrointestinal: Negative.   Genitourinary: Negative.   Musculoskeletal: Negative.   Skin: Negative.   Neurological: Negative.   Endo/Heme/Allergies: Negative.    Psychiatric/Behavioral: Negative.      Exam: @IPVITALS @ Physical Exam Vitals reviewed.  HENT:     Head: Normocephalic and atraumatic.  Eyes:     Pupils: Pupils are equal, round, and reactive to light.  Cardiovascular:     Rate and Rhythm: Normal rate and regular rhythm.     Heart sounds: Normal heart sounds.  Pulmonary:     Effort: Pulmonary effort is normal.     Breath sounds: Normal breath sounds.  Abdominal:     General: Bowel sounds are normal.     Palpations: Abdomen is soft.  Musculoskeletal:        General: No tenderness or deformity. Normal range of motion.     Cervical back: Normal range of motion.  Lymphadenopathy:     Cervical: No cervical adenopathy.  Skin:    General: Skin is warm and dry.     Findings: No erythema or rash.     Comments: Skin exam shows numerous hyperpigmented lesions.  Some are quite dark.  It is hard to say if any look suspicious.  Neurological:     Mental Status: He is alert and oriented to person, place, and time.  Psychiatric:        Behavior: Behavior normal.        Thought Content: Thought content normal.        Judgment: Judgment normal.      Recent Labs    03/11/21 1347  WBC 8.9  HGB 17.9*  HCT 51.1  PLT 221    Recent Labs    03/11/21 1347  NA 138  K 4.1  CL 104  CO2 27  GLUCOSE 81  BUN 18  CREATININE 1.00  CALCIUM 10.0    Blood smear review: Unremarkable  Pathology: None    Assessment and Plan: Anthony Sharp is of nice 30 year old white male.  He has mild erythrocytosis.  Again I suspect this is probably a secondary erythrocytosis from smoking and him having high blood pressure.  This might be spurious polycythemia.  We will have to see what his iron levels show.  His iron saturation and does not be criteria for hemochromatosis.  However, we will see what the hemochromatosis genetic panel shows.  I told him that the best thing he can do overall is to donate blood.  I will see no reason why he could not  donate blood to the [facility].  This will certainly help the [facility].  He has enough blood that he could easily donate.  If he does have hemochromatosis, I am sure we can easily get his iron under control.  I do not see that we have to do a bone marrow test on him.  I do not see that he needs a myeloproliferative panel done.  Again I think the erythrocytosis is being driven by his tobacco use and high blood pressure.  We will see about getting him back depending on what the hemochromatosis results show.  He is very nice.  It was nice talking to him.  I must say that he does have a very interesting job.

## 2021-03-12 LAB — ERYTHROPOIETIN: Erythropoietin: 9.2 m[IU]/mL (ref 2.6–18.5)

## 2021-03-15 LAB — IRON AND IRON BINDING CAPACITY (CC-WL,HP ONLY)
Iron: 183 ug/dL — ABNORMAL HIGH (ref 45–182)
Saturation Ratios: 54 % — ABNORMAL HIGH (ref 17.9–39.5)
TIBC: 340 ug/dL (ref 250–450)
UIBC: 157 ug/dL (ref 117–376)

## 2021-03-15 LAB — FERRITIN: Ferritin: 252 ng/mL (ref 24–336)

## 2021-03-16 ENCOUNTER — Telehealth: Payer: Self-pay | Admitting: *Deleted

## 2021-03-16 NOTE — Telephone Encounter (Signed)
We will call him once we get results back from his lab work.

## 2021-03-17 LAB — HEMOCHROMATOSIS DNA-PCR(C282Y,H63D)

## 2021-03-28 ENCOUNTER — Other Ambulatory Visit: Payer: Self-pay

## 2021-03-28 ENCOUNTER — Inpatient Hospital Stay: Payer: BC Managed Care – PPO | Attending: Hematology & Oncology

## 2021-03-28 DIAGNOSIS — E119 Type 2 diabetes mellitus without complications: Secondary | ICD-10-CM | POA: Insufficient documentation

## 2021-03-28 DIAGNOSIS — Z79899 Other long term (current) drug therapy: Secondary | ICD-10-CM | POA: Insufficient documentation

## 2021-03-28 DIAGNOSIS — F17210 Nicotine dependence, cigarettes, uncomplicated: Secondary | ICD-10-CM | POA: Diagnosis not present

## 2021-03-28 DIAGNOSIS — D751 Secondary polycythemia: Secondary | ICD-10-CM | POA: Diagnosis present

## 2021-03-28 NOTE — Patient Instructions (Signed)
Therapeutic Phlebotomy °Therapeutic phlebotomy is the planned removal of blood from a person's body for the purpose of treating a medical condition. The procedure is lot like donating blood. Usually, about a pint (470 mL, or 0.47 L) of blood is removed. The average adult has 9-12 pints (4.3-5.7 L) of blood in his or her body. °Therapeutic phlebotomy may be used to treat the following medical conditions: °Hemochromatosis. This is a condition in which the blood contains too much iron. °Polycythemia vera. This is a condition in which the blood contains too many red blood cells. °Porphyria cutanea tarda. This is a disease in which an important part of hemoglobin is not made properly. It results in the buildup of abnormal amounts of porphyrins in the body. °Sickle cell disease. This is a condition in which the red blood cells form an abnormal crescent shape rather than a round shape. °Tell a health care provider about: °Any allergies you have. °All medicines you are taking, including vitamins, herbs, eye drops, creams, and over-the-counter medicines. °Any bleeding problems you have. °Any surgeries you have had. °Any medical conditions you have. °Whether you are pregnant or may be pregnant. °What are the risks? °Generally, this is a safe procedure. However, problems may occur, including: °Nausea or light-headedness. °Low blood pressure (hypotension). °Soreness, bleeding, swelling, or bruising at the needle insertion site. °Infection. °What happens before the procedure? °Ask your health care provider about: °Changing or stopping your regular medicines. This is especially important if you are taking diabetes medicines or blood thinners. °Taking medicines such as aspirin and ibuprofen. These medicines can thin your blood. Do not take these medicines unless your health care provider tells you to take them. °Taking over-the-counter medicines, vitamins, herbs, and supplements. °Wear clothing with sleeves that can be raised  above the elbow. °You may have a blood sample taken. °Your blood pressure, pulse rate, and breathing rate will be measured. °What happens during the procedure? ° °You may be given a medicine to numb the area (local anesthetic). °A tourniquet will be placed on your arm. °A needle will be put into one of your veins. °Tubing and a collection bag will be attached to the needle. °Blood will flow through the needle and tubing into the collection bag. °The collection bag will be placed lower than your arm so gravity can help the blood flow into the bag. °You may be asked to open and close your hand slowly and continually during the entire collection. °After the specified amount of blood has been removed from your body, the collection bag and tubing will be clamped. °The needle will be removed from your vein. °Pressure will be held on the needle site to stop the bleeding. °A bandage (dressing) will be placed over the needle insertion site. °The procedure may vary among health care providers and hospitals. °What happens after the procedure? °Your blood pressure, pulse rate, and breathing rate will be measured after the procedure. °You will be encouraged to drink fluids. °You will be encouraged to eat a snack to prevent a low blood sugar level. °Your recovery will be assessed and monitored. °Return to your normal activities as told by your health care provider. °Summary °Therapeutic phlebotomy is the planned removal of blood from a person's body for the purpose of treating a medical condition. °Therapeutic phlebotomy may be used to treat hemochromatosis, polycythemia vera, porphyria cutanea tarda, or sickle cell disease. °In the procedure, a needle is inserted and about a pint (470 mL, or 0.47 L) of blood is   removed. The average adult has 9-12 pints (4.3-5.7 L) of blood in the body. °This is generally a safe procedure, but it can sometimes cause problems such as nausea, light-headedness, or low blood pressure  (hypotension). °This information is not intended to replace advice given to you by your health care provider. Make sure you discuss any questions you have with your health care provider. °Document Revised: 09/01/2020 Document Reviewed: 09/01/2020 °Elsevier Patient Education © 2022 Elsevier Inc. ° °

## 2021-04-04 ENCOUNTER — Inpatient Hospital Stay: Payer: BC Managed Care – PPO

## 2021-04-04 ENCOUNTER — Other Ambulatory Visit: Payer: Self-pay

## 2021-04-04 DIAGNOSIS — D751 Secondary polycythemia: Secondary | ICD-10-CM | POA: Diagnosis not present

## 2021-04-04 NOTE — Progress Notes (Signed)
Hinton Dyer presents today for phlebotomy per MD orders. Phlebotomy procedure started at 1333 and ended at 1342. 519 grams removed from lt AC using 18g IV cath by [facility], RN Patient observed for 30 minutes after procedure without any incident. Patient tolerated procedure well. IV needle removed intact.

## 2021-04-04 NOTE — Progress Notes (Signed)
Pt d/c. Vss.

## 2021-04-04 NOTE — Patient Instructions (Signed)

## 2021-04-11 ENCOUNTER — Other Ambulatory Visit: Payer: Self-pay

## 2021-04-11 ENCOUNTER — Inpatient Hospital Stay: Payer: BC Managed Care – PPO

## 2021-04-11 DIAGNOSIS — D751 Secondary polycythemia: Secondary | ICD-10-CM | POA: Diagnosis not present

## 2021-04-11 NOTE — Patient Instructions (Signed)

## 2021-04-11 NOTE — Progress Notes (Signed)
Anthony Sharp presents today for phlebotomy per MD orders. Phlebotomy procedure started at 1332 and ended at 1340 grams removed via 18 gauge needle to right AC.  Patient observed for 30 minutes after procedure without any incident. Patient tolerated procedure well and received replacement fluids after procedure.  Patient understands to call if he has any questions or concerns post discharge.

## 2021-04-22 ENCOUNTER — Other Ambulatory Visit: Payer: Self-pay | Admitting: *Deleted

## 2021-04-25 ENCOUNTER — Encounter: Payer: Self-pay | Admitting: Hematology & Oncology

## 2021-04-25 ENCOUNTER — Other Ambulatory Visit: Payer: Self-pay

## 2021-04-25 ENCOUNTER — Inpatient Hospital Stay: Payer: BC Managed Care – PPO | Admitting: Hematology & Oncology

## 2021-04-25 ENCOUNTER — Inpatient Hospital Stay: Payer: BC Managed Care – PPO | Attending: Hematology & Oncology

## 2021-04-25 DIAGNOSIS — F17210 Nicotine dependence, cigarettes, uncomplicated: Secondary | ICD-10-CM | POA: Diagnosis not present

## 2021-04-25 DIAGNOSIS — Z7189 Other specified counseling: Secondary | ICD-10-CM | POA: Diagnosis not present

## 2021-04-25 HISTORY — DX: Other specified counseling: Z71.89

## 2021-04-25 HISTORY — DX: Hereditary hemochromatosis: E83.110

## 2021-04-25 LAB — CMP (CANCER CENTER ONLY)
ALT: 32 U/L (ref 0–44)
AST: 20 U/L (ref 15–41)
Albumin: 4.4 g/dL (ref 3.5–5.0)
Alkaline Phosphatase: 48 U/L (ref 38–126)
Anion gap: 8 (ref 5–15)
BUN: 17 mg/dL (ref 6–20)
CO2: 26 mmol/L (ref 22–32)
Calcium: 9.3 mg/dL (ref 8.9–10.3)
Chloride: 103 mmol/L (ref 98–111)
Creatinine: 1.34 mg/dL — ABNORMAL HIGH (ref 0.61–1.24)
GFR, Estimated: >60 mL/min
Glucose, Bld: 87 mg/dL (ref 70–99)
Potassium: 3.9 mmol/L (ref 3.5–5.1)
Sodium: 137 mmol/L (ref 135–145)
Total Bilirubin: 0.5 mg/dL (ref 0.3–1.2)
Total Protein: 6.9 g/dL (ref 6.5–8.1)

## 2021-04-25 LAB — CBC WITH DIFFERENTIAL (CANCER CENTER ONLY)
Abs Immature Granulocytes: 0.06 K/uL (ref 0.00–0.07)
Basophils Absolute: 0.1 K/uL (ref 0.0–0.1)
Basophils Relative: 1 %
Eosinophils Absolute: 0.5 K/uL (ref 0.0–0.5)
Eosinophils Relative: 5 %
HCT: 44.9 % (ref 39.0–52.0)
Hemoglobin: 15.6 g/dL (ref 13.0–17.0)
Immature Granulocytes: 1 %
Lymphocytes Relative: 26 %
Lymphs Abs: 2.5 K/uL (ref 0.7–4.0)
MCH: 31.1 pg (ref 26.0–34.0)
MCHC: 34.7 g/dL (ref 30.0–36.0)
MCV: 89.4 fL (ref 80.0–100.0)
Monocytes Absolute: 0.8 K/uL (ref 0.1–1.0)
Monocytes Relative: 8 %
Neutro Abs: 5.6 K/uL (ref 1.7–7.7)
Neutrophils Relative %: 59 %
Platelet Count: 211 K/uL (ref 150–400)
RBC: 5.02 MIL/uL (ref 4.22–5.81)
RDW: 13 % (ref 11.5–15.5)
WBC Count: 9.4 K/uL (ref 4.0–10.5)
nRBC: 0 % (ref 0.0–0.2)

## 2021-04-25 NOTE — Progress Notes (Signed)
Hematology and Oncology Follow Up Visit  Anthony Sharp [id number] [birth year] 31 y.o. 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  As always, he is incredibly interesting to talk to.  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  He does have hemochromatosis.  When we first saw him, we checked him and he is heterozygous for the major mutation- C282Y.  He is already given blood 3 times.  When we first saw him, his iron saturation was 54%.  The ferritin was 252.  Again I think that he would be a great candidate for donations.  His blood is safe.  It would certainly help out others if he donated blood to the [facility].  He has had no problems with cough or shortness of breath.  There is no nausea or vomiting.  He has had no issues with rashes.  He has had no problems with fever.  There is been no issues with COVID.  Overall, his performance status is ECOG 0.  Medications:  Current Outpatient Medications:    amLODipine (NORVASC) 10 MG tablet, Take 1 tablet by mouth once daily, Disp: 90 tablet, Rfl: 3   ibuprofen (ADVIL,MOTRIN) 200 MG tablet, Take 200 mg by mouth every 6 (six) hours as needed for headache, mild pain or moderate pain., Disp: , Rfl:    irbesartan (AVAPRO) 75 MG tablet, 1 tab po qd, Disp: 90 tablet, Rfl: 3  Allergies:  Allergies  Allergen Reactions   Bee Venom Swelling   Oxycodone Itching   Chlorthalidone Other (See Comments)    Tinnitus and insomnia    Past Medical History, Surgical history, Social history, and Family History were reviewed and updated.  Review of Systems: Review of Systems  Constitutional: Negative.   HENT:  Negative.    Eyes: Negative.   Respiratory: Negative.    Cardiovascular: Negative.    Gastrointestinal: Negative.   Endocrine: Negative.   Genitourinary: Negative.    Musculoskeletal: Negative.   Skin: Negative.   Neurological: Negative.   Hematological: Negative.   Psychiatric/Behavioral: Negative.     Physical Exam:  weight is 269 lb (122 kg). His oral temperature is 98.2 F (36.8 C). His blood pressure is 121/76 and his pulse is 87. His respiration is 18 and oxygen saturation is 98%.   Wt Readings from Last 3 Encounters:  04/25/21 269 lb (122 kg)  03/11/21 266 lb (120.7 kg)  02/28/21 271 lb 12.8 oz (123.3 kg)    Physical Exam Vitals reviewed.  HENT:     Head: Normocephalic and atraumatic.  Eyes:     Pupils: Pupils are equal, round, and reactive to light.  Cardiovascular:     Rate and Rhythm: Normal rate and regular rhythm.     Heart sounds: Normal heart sounds.  Pulmonary:     Effort: Pulmonary effort is normal.     Breath sounds: Normal breath sounds.  Abdominal:     General: Bowel sounds are normal.     Palpations: Abdomen is soft.  Musculoskeletal:        General: No tenderness or deformity. Normal range of motion.     Cervical back: Normal range of motion.  Lymphadenopathy:     Cervical: No cervical adenopathy.  Skin:    General: Skin is  warm and dry.     Findings: No erythema or rash.  Neurological:     Mental Status: He is alert and oriented to person, place, and time.  Psychiatric:        Behavior: Behavior normal.        Thought Content: Thought content normal.        Judgment: Judgment normal.     Lab Results  Component Value Date   WBC 9.4 04/25/2021   HGB 15.6 04/25/2021   HCT 44.9 04/25/2021   MCV 89.4 04/25/2021   PLT 211 04/25/2021     Chemistry      Component Value Date/Time   NA 137 04/25/2021 1459   K 3.9 04/25/2021 1459   CL 103 04/25/2021 1459   CO2 26 04/25/2021 1459   BUN 17 04/25/2021 1459   CREATININE 1.34 (H) 04/25/2021 1459   CREATININE 1.06 09/03/2018 0831      Component Value Date/Time   CALCIUM 9.3  04/25/2021 1459   ALKPHOS 48 04/25/2021 1459   AST 20 04/25/2021 1459   ALT 32 04/25/2021 1459   BILITOT 0.5 04/25/2021 1459      Impression and Plan: Anthony Sharp is a very nice 31 year old white male.  He has hemochromatosis.  He says that he thinks that the hemochromatosis probably came from his father side of the family.  A cousin on the father side of the family also has hemochromatosis.  He really should have no problems with this.  We will be aggressive with phlebotomies and donations.  We will see what his iron studies are today.  I think that again we can probably get him back every 3 months.  I think if he donated to the [facility] in between visits, this would certainly help the cause.  Again his job is incredibly interesting.  It is always fun talking to him about this.   [name], MD 2/6/20234:00 PM

## 2021-04-26 ENCOUNTER — Telehealth: Payer: Self-pay | Admitting: *Deleted

## 2021-04-26 LAB — IRON AND IRON BINDING CAPACITY (CC-WL,HP ONLY)
Iron: 98 ug/dL (ref 45–182)
Saturation Ratios: 25 % (ref 17.9–39.5)
TIBC: 393 ug/dL (ref 250–450)
UIBC: 295 ug/dL (ref 117–376)

## 2021-04-26 LAB — FERRITIN: Ferritin: 67 ng/mL (ref 24–336)

## 2021-04-26 NOTE — Telephone Encounter (Signed)
As noted below by Dr. Marin Olp, I informed him that his iron level is much better. You still need to donate blood which will really help you. He verbalized understanding.

## 2021-04-26 NOTE — Telephone Encounter (Signed)
-----   Message from [name], MD sent at 04/26/2021 10:33 AM EST ----- Please call and let him know that the iron level is much better.  He still needs to donate blood which will really help him and the community out.  Thanks.Anthony Sharp

## 2021-06-22 ENCOUNTER — Ambulatory Visit: Payer: BC Managed Care – PPO | Admitting: Family Medicine

## 2021-06-22 ENCOUNTER — Encounter: Payer: Self-pay | Admitting: Family Medicine

## 2021-06-22 VITALS — BP 120/83 | HR 83 | Temp 98.8°F | Wt 260.4 lb

## 2021-06-22 DIAGNOSIS — I10 Essential (primary) hypertension: Secondary | ICD-10-CM

## 2021-06-22 DIAGNOSIS — Z1283 Encounter for screening for malignant neoplasm of skin: Secondary | ICD-10-CM | POA: Diagnosis not present

## 2021-06-22 DIAGNOSIS — M546 Pain in thoracic spine: Secondary | ICD-10-CM | POA: Diagnosis not present

## 2021-06-22 DIAGNOSIS — D225 Melanocytic nevi of trunk: Secondary | ICD-10-CM | POA: Diagnosis not present

## 2021-06-22 MED ORDER — AMLODIPINE BESYLATE 10 MG PO TABS: 10.0000 mg | ORAL_TABLET | Freq: Every day | ORAL | 0 refills | Status: DC

## 2021-06-22 MED ORDER — MELOXICAM 15 MG PO TABS: 15.0000 mg | ORAL_TABLET | Freq: Every day | ORAL | 0 refills | Status: DC

## 2021-06-22 MED ORDER — CYCLOBENZAPRINE HCL 10 MG PO TABS: 10.0000 mg | ORAL_TABLET | Freq: Three times a day (TID) | ORAL | 0 refills | Status: DC | PRN

## 2021-06-22 MED ORDER — KETOROLAC TROMETHAMINE 60 MG/2ML IM SOLN
60.0000 mg | Freq: Once | INTRAMUSCULAR | Status: AC
  Administered 2021-06-22: 60 mg via INTRAMUSCULAR

## 2021-06-22 NOTE — Progress Notes (Signed)
OFFICE VISIT ? ?06/22/2021 ? ?CC:  ?Chief Complaint  ?Patient presents with  ? Back Pain  ?  Has hurt so bad that it made him want to throw up.Hurts when he moves a certain way or lifts his right arm.   ? ?Patient is a 31 y.o. male who presents for low back pain. ? ?HPI: ?Acute onset yesterday morning of pain in the right side of back at the thoracic level.  No preceding injury or incident such as straining or lifting.  The pain does not radiate.  Feels sharp and is worsened by rotating torso. ?If he is sitting and not moving around at all his intensity is 3 out of 10.  When he moves certain positions it goes to about 10 out of 10.  No paresthesias or rash.  He tried Advil and it did not help.  Application of heat helped a little bit. ?He has a mild level of lumbar back pain chronically. ? ? ?Controlled: No opioids listed on PDMP. ?No red flags. ? ?Past Medical History:  ?Diagnosis Date  ? Anxiety   ? Erythrocytosis   ? elev transferrin sat  ? Goals of care, counseling/discussion 04/25/2021  ? Hemochromatosis, hereditary ([place]) 04/25/2021  ? Hypertension   ? Obesity, Class II, BMI 35-39.9   ? ?Past Surgical History:  ?Procedure Laterality Date  ? BACK SURGERY  2018  ? L5, S1  ? ? ?Outpatient Medications Prior to Visit  ?Medication Sig Dispense Refill  ? ibuprofen (ADVIL,MOTRIN) 200 MG tablet Take 200 mg by mouth every 6 (six) hours as needed for headache, mild pain or moderate pain.    ? irbesartan (AVAPRO) 75 MG tablet 1 tab po qd 90 tablet 3  ? amLODipine (NORVASC) 10 MG tablet Take 1 tablet by mouth once daily 90 tablet 3  ? ?No facility-administered medications prior to visit.  ? ? ?Allergies  ?Allergen Reactions  ? Bee Venom Swelling  ? Oxycodone Itching  ? Chlorthalidone Other (See Comments)  ?  Tinnitus and insomnia  ? ? ?ROS ?As per HPI ? ?PE: ? ?  06/22/2021  ?  3:42 PM 04/25/2021  ?  3:17 PM 04/11/2021  ?  1:53 PM  ?Vitals with BMI  ?Weight 260 lbs 6 oz 269 lbs   ?Systolic 161 096 045  ?Diastolic 83 76 81  ?Pulse 83  87 84  ? ? ? ?Physical Exam ? ?Normal: Alert and well-appearing. ?Back range of motion: Flexion of lumbar and thoracic spine does make his right sided thoracic pain worse. ?Rotation elicits the pain worse, particularly rotating to the right.  Lateral bending is fine.  Extension is fine. ?Minimal tenderness to palpation in about a fist-sized area inferomedial to the right scapula.  No trigger point.  No palpable muscle spasm or nodularity.  This is not tender over the facet joints or spinous processes.  No rash. ?Scattered pigmented nevi of various shades are present all over his trunk. ? ?LABS:  ?Last metabolic panel ?Lab Results  ?Component Value Date  ? GLUCOSE 87 04/25/2021  ? NA 137 04/25/2021  ? K 3.9 04/25/2021  ? CL 103 04/25/2021  ? CO2 26 04/25/2021  ? BUN 17 04/25/2021  ? CREATININE 1.34 (H) 04/25/2021  ? GFRNONAA >60 04/25/2021  ? CALCIUM 9.3 04/25/2021  ? PROT 6.9 04/25/2021  ? ALBUMIN 4.4 04/25/2021  ? BILITOT 0.5 04/25/2021  ? ALKPHOS 48 04/25/2021  ? AST 20 04/25/2021  ? ALT 32 04/25/2021  ? ANIONGAP 8 04/25/2021  ? ?  IMPRESSION AND PLAN: ? ?#1 acute right sided thoracic myofascial pain. ?Toradol 60 mg in office today. ?Start meloxicam 15 mg a day tomorrow and take for 10 consecutive days. ?Flexeril 10 mg 3 times daily as needed, #30, no refill. ?Apply heat, do gentle range of motion exercises. ?Work note for today and tomorrow. ? ?An After Visit Summary was printed and given to the patient. ? ?FOLLOW UP: Return if symptoms worsen or fail to improve. ? ?Signed:  Crissie Sickles, MD           06/22/2021 ? ?

## 2021-06-22 NOTE — Addendum Note (Signed)
Addended by: Octaviano Glow on: 06/22/2021 04:52 PM ? ? Modules accepted: Orders ? ?

## 2021-07-13 ENCOUNTER — Encounter: Payer: Self-pay | Admitting: Family Medicine

## 2021-07-13 ENCOUNTER — Ambulatory Visit: Payer: BC Managed Care – PPO | Admitting: Family Medicine

## 2021-07-13 VITALS — BP 102/73 | HR 86 | Temp 98.3°F | Ht 71.75 in | Wt 257.2 lb

## 2021-07-13 DIAGNOSIS — I952 Hypotension due to drugs: Secondary | ICD-10-CM | POA: Diagnosis not present

## 2021-07-13 DIAGNOSIS — I10 Essential (primary) hypertension: Secondary | ICD-10-CM | POA: Diagnosis not present

## 2021-07-13 DIAGNOSIS — R5383 Other fatigue: Secondary | ICD-10-CM | POA: Diagnosis not present

## 2021-07-13 LAB — COMPREHENSIVE METABOLIC PANEL
ALT: 34 U/L (ref 0–53)
AST: 22 U/L (ref 0–37)
Albumin: 4.7 g/dL (ref 3.5–5.2)
Alkaline Phosphatase: 50 U/L (ref 39–117)
BUN: 12 mg/dL (ref 6–23)
CO2: 26 mEq/L (ref 19–32)
Calcium: 9.1 mg/dL (ref 8.4–10.5)
Chloride: 102 mEq/L (ref 96–112)
Creatinine, Ser: 0.94 mg/dL (ref 0.40–1.50)
GFR: 107.05 mL/min (ref >60.00)
Glucose, Bld: 81 mg/dL (ref 70–99)
Potassium: 4.1 mEq/L (ref 3.5–5.1)
Sodium: 137 mEq/L (ref 135–145)
Total Bilirubin: 0.7 mg/dL (ref 0.2–1.2)
Total Protein: 7.3 g/dL (ref 6.0–8.3)

## 2021-07-13 LAB — CBC WITH DIFFERENTIAL/PLATELET
Basophils Absolute: 0.1 10*3/uL (ref 0.0–0.1)
Basophils Relative: 0.9 % (ref 0.0–3.0)
Eosinophils Absolute: 0.4 10*3/uL (ref 0.0–0.7)
Eosinophils Relative: 7 % — ABNORMAL HIGH (ref 0.0–5.0)
HCT: 51.8 % (ref 39.0–52.0)
Hemoglobin: 17.4 g/dL — ABNORMAL HIGH (ref 13.0–17.0)
Lymphocytes Relative: 27.1 % (ref 12.0–46.0)
Lymphs Abs: 1.7 10*3/uL (ref 0.7–4.0)
MCHC: 33.6 g/dL (ref 30.0–36.0)
MCV: 89.5 fl (ref 78.0–100.0)
Monocytes Absolute: 0.6 10*3/uL (ref 0.1–1.0)
Monocytes Relative: 9.1 % (ref 3.0–12.0)
Neutro Abs: 3.4 10*3/uL (ref 1.4–7.7)
Neutrophils Relative %: 55.9 % (ref 43.0–77.0)
Platelets: 215 10*3/uL (ref 150.0–400.0)
RBC: 5.78 Mil/uL (ref 4.22–5.81)
RDW: 13 % (ref 11.5–15.5)
WBC: 6.1 10*3/uL (ref 4.0–10.5)

## 2021-07-13 LAB — IBC + FERRITIN
Ferritin: 59.7 ng/mL (ref 22.0–322.0)
Iron: 143 ug/dL (ref 42–165)
Saturation Ratios: 38.4 % (ref 20.0–50.0)
TIBC: 372.4 ug/dL (ref 250.0–450.0)
Transferrin: 266 mg/dL (ref 212.0–360.0)

## 2021-07-13 NOTE — Progress Notes (Signed)
OFFICE VISIT ? ?07/13/2021 ? ?CC:  ?Chief Complaint  ?Patient presents with  ? Fatigue  ?  2-3 days now, concerned about iron level. Est care with Dr.Ennever on 5/5 along with labs  ? ?HPI:   ? ?Patient is a 31 y.o. male who presents for feeling a bit of mild malaise that started 3 days ago. ?No aching, no fever, no appetite changes.  Very slight headaches. ? ?He describes a long history of brief periods of unprovoked excessive anxiety accompanied by sweating and palpitations.  The last 5 to 10 minutes, sometimes dissipate over 30 minutes.  He was on alprazolam in the remote past for these.  When he took alprazolam they seem to occur much less often. ?Last couple months he estimates these have been happening 1 time a week. ? ?Home blood pressure checks have been low normal. ?He hydrates very well.  He did stop caffeine recently and wonders if this may be contributing. ?No depressed mood.  No excessive daytime sleepiness. ? ?He has hemochromatosis, most recent phlebotomy was about [facility] months ago at [facility]. ?He does not give blood donations at any other site. ? ?ROS as above, plus-->no CP, no SOB, no wheezing, no cough, no dizziness, no rashes, no melena/hematochezia.  No polyuria or polydipsia.  No myalgias or arthralgias.  No focal weakness, paresthesias, or tremors.  No acute vision or hearing abnormalities.  No dysuria or unusual/new urinary urgency or frequency.  No recent changes in lower legs. ?No n/v/d or abd pain.   ? ? ?Past Medical History:  ?Diagnosis Date  ? Anxiety   ? Erythrocytosis   ? elev transferrin sat  ? Goals of care, counseling/discussion 04/25/2021  ? Hemochromatosis, hereditary ([place]) 04/25/2021  ? Hypertension   ? Obesity, Class II, BMI 35-39.9   ? ? ?Past Surgical History:  ?Procedure Laterality Date  ? BACK SURGERY  2018  ? L5, S1  ? ? ?Outpatient Medications Prior to Visit  ?Medication Sig Dispense Refill  ? amLODipine (NORVASC) 10 MG tablet Take 1 tablet (10 mg total) by mouth daily. 90  tablet 0  ? ibuprofen (ADVIL,MOTRIN) 200 MG tablet Take 200 mg by mouth every 6 (six) hours as needed for headache, mild pain or moderate pain.    ? irbesartan (AVAPRO) 75 MG tablet 1 tab po qd 90 tablet 3  ? cyclobenzaprine (FLEXERIL) 10 MG tablet Take 1 tablet (10 mg total) by mouth 3 (three) times daily as needed for muscle spasms. (Patient not taking: Reported on 07/13/2021) 30 tablet 0  ? meloxicam (MOBIC) 15 MG tablet Take 1 tablet (15 mg total) by mouth daily. (Patient not taking: Reported on 07/13/2021) 10 tablet 0  ? ?No facility-administered medications prior to visit.  ? ? ?Allergies  ?Allergen Reactions  ? Bee Venom Swelling  ? Oxycodone Itching  ? Chlorthalidone Other (See Comments)  ?  Tinnitus and insomnia  ? ? ?ROS ?As per HPI ? ?PE: ? ?  07/13/2021  ?  9:39 AM 06/22/2021  ?  3:42 PM 04/25/2021  ?  3:17 PM  ?Vitals with BMI  ?Height 5' 11.75"    ?Weight 257 lbs 3 oz 260 lbs 6 oz 269 lbs  ?BMI 35.14    ?Systolic 329 191 660  ?Diastolic 73 83 76  ?Pulse 86 83 87  ? ? ? ?Physical Exam ? ?Gen: Alert, well appearing.  Patient is oriented to person, place, time, and situation. ?AFFECT: pleasant, lucid thought and speech. ?AYO:KHTX: no injection, icteris, swelling,  or exudate.  EOMI, PERRLA. ?Mouth: lips without lesion/swelling.  Oral mucosa pink and moist. Oropharynx without erythema, exudate, or swelling.  ?Neck - No masses or thyromegaly or limitation in range of motion: ?CV: RRR, no m/r/g.   ?LUNGS: CTA bilat, nonlabored resps, good aeration in all lung fields. ?ABD: soft, NT/ND ?EXT: no clubbing or cyanosis.  no edema.  ?Skin: No rubor ? ?LABS:  ?Last CBC ?Lab Results  ?Component Value Date  ? WBC 9.4 04/25/2021  ? HGB 15.6 04/25/2021  ? HCT 44.9 04/25/2021  ? MCV 89.4 04/25/2021  ? MCH 31.1 04/25/2021  ? RDW 13.0 04/25/2021  ? PLT 211 04/25/2021  ? ?Lab Results  ?Component Value Date  ? IRON 98 04/25/2021  ? TIBC 393 04/25/2021  ? FERRITIN 67 04/25/2021  ?Transferrin sat ratio 25% 04/25/21 ? ?Last metabolic  panel ?Lab Results  ?Component Value Date  ? GLUCOSE 87 04/25/2021  ? NA 137 04/25/2021  ? K 3.9 04/25/2021  ? CL 103 04/25/2021  ? CO2 26 04/25/2021  ? BUN 17 04/25/2021  ? CREATININE 1.34 (H) 04/25/2021  ? GFRNONAA >60 04/25/2021  ? CALCIUM 9.3 04/25/2021  ? PROT 6.9 04/25/2021  ? ALBUMIN 4.4 04/25/2021  ? BILITOT 0.5 04/25/2021  ? ALKPHOS 48 04/25/2021  ? AST 20 04/25/2021  ? ALT 32 04/25/2021  ? ANIONGAP 8 04/25/2021  ? ?Lab Results  ?Component Value Date  ? TSH 1.93 01/31/2021  ? ?IMPRESSION AND PLAN: ? ?#1 nonspecific malaise, mild.  Unclear etiology.  Could be a result of cutting way back on caffeine recently. ?We will check his CBC and iron panel today as well as a c-Met.  We will forward results to his hematologist. ?In case his symptoms are result of borderline low blood pressure I have advised him to stop his irbesartan and just stay on amlodipine 10 mg a day for now and monitor blood pressure.  Would like him to be 599-357 systolic over 01-77 diastolic. ? ?An After Visit Summary was printed and given to the patient. ? ?FOLLOW UP: Return for keep 08/23/21 appt. ? ?Signed:  Crissie Sickles, MD           07/13/2021 ? ? ?

## 2021-07-13 NOTE — Patient Instructions (Signed)
Stop your irbesartan.   ?Continue to take amlodipine '10mg'$  daily. ?Monitor blood pressure daily for 1 week.  Goal bp range is 120-130 on top and 70-80 on bottom. ? ?

## 2021-07-22 ENCOUNTER — Inpatient Hospital Stay: Payer: BC Managed Care – PPO | Admitting: Hematology & Oncology

## 2021-07-22 ENCOUNTER — Encounter: Payer: Self-pay | Admitting: Hematology & Oncology

## 2021-07-22 ENCOUNTER — Inpatient Hospital Stay: Payer: BC Managed Care – PPO | Attending: Hematology & Oncology

## 2021-07-22 DIAGNOSIS — F17210 Nicotine dependence, cigarettes, uncomplicated: Secondary | ICD-10-CM | POA: Diagnosis not present

## 2021-07-22 LAB — CBC WITH DIFFERENTIAL (CANCER CENTER ONLY)
Abs Immature Granulocytes: 0.03 10*3/uL (ref 0.00–0.07)
Basophils Absolute: 0.1 10*3/uL (ref 0.0–0.1)
Basophils Relative: 1 %
Eosinophils Absolute: 0.7 10*3/uL — ABNORMAL HIGH (ref 0.0–0.5)
Eosinophils Relative: 9 %
HCT: 48.4 % (ref 39.0–52.0)
Hemoglobin: 16.9 g/dL (ref 13.0–17.0)
Immature Granulocytes: 0 %
Lymphocytes Relative: 31 %
Lymphs Abs: 2.5 10*3/uL (ref 0.7–4.0)
MCH: 30.6 pg (ref 26.0–34.0)
MCHC: 34.9 g/dL (ref 30.0–36.0)
MCV: 87.7 fL (ref 80.0–100.0)
Monocytes Absolute: 0.7 10*3/uL (ref 0.1–1.0)
Monocytes Relative: 9 %
Neutro Abs: 4 10*3/uL (ref 1.7–7.7)
Neutrophils Relative %: 50 %
Platelet Count: 231 10*3/uL (ref 150–400)
RBC: 5.52 MIL/uL (ref 4.22–5.81)
RDW: 12.2 % (ref 11.5–15.5)
WBC Count: 7.9 10*3/uL (ref 4.0–10.5)
nRBC: 0 % (ref 0.0–0.2)

## 2021-07-22 LAB — CMP (CANCER CENTER ONLY)
ALT: 27 U/L (ref 0–44)
AST: 19 U/L (ref 15–41)
Albumin: 4.4 g/dL (ref 3.5–5.0)
Alkaline Phosphatase: 49 U/L (ref 38–126)
Anion gap: 6 (ref 5–15)
BUN: 16 mg/dL (ref 6–20)
CO2: 29 mmol/L (ref 22–32)
Calcium: 9.5 mg/dL (ref 8.9–10.3)
Chloride: 102 mmol/L (ref 98–111)
Creatinine: 1.08 mg/dL (ref 0.61–1.24)
GFR, Estimated: >60 mL/min (ref >60)
Glucose, Bld: 105 mg/dL — ABNORMAL HIGH (ref 70–99)
Potassium: 4.1 mmol/L (ref 3.5–5.1)
Sodium: 137 mmol/L (ref 135–145)
Total Bilirubin: 0.5 mg/dL (ref 0.3–1.2)
Total Protein: 7 g/dL (ref 6.5–8.1)

## 2021-07-22 LAB — IRON AND IRON BINDING CAPACITY (CC-WL,HP ONLY)
Iron: 182 ug/dL (ref 45–182)
Saturation Ratios: 51 % — ABNORMAL HIGH (ref 17.9–39.5)
TIBC: 354 ug/dL (ref 250–450)
UIBC: 172 ug/dL (ref 117–376)

## 2021-07-22 LAB — FERRITIN: Ferritin: 39 ng/mL (ref 24–336)

## 2021-07-22 NOTE — Progress Notes (Signed)
?Hematology and Oncology Follow Up Visit ? ?Anthony Sharp ?[id number] ?November 20, 1990 31 y.o. ?07/22/2021 ? ? 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 ?Current Therapy:   ?Phlebotomy/blood donation to keep ferritin below 100 and iron saturation below 30% ?    ?Interim History:  Anthony Sharp is back for follow-up.  He is doing pretty well.  He has been staying quite busy with work.  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 ? ?He apparently had lab work done a week ago had his family doctor.  This showed a ferritin of 58 with an iron saturation of 38%. ? ?He has not been donating blood.  I told him that he needs to keep donating blood.  This is working.  It will help him out will help the community. ? ?He has had no problems with nausea or vomiting.  He has had no problems with cough or shortness of breath.  He has had no change in bowel or bladder habits.  There is been no rashes.  Is had no leg swelling. ? ?Overall, his performance status is ECOG 0.   ? ?Medications:  ?Current Outpatient Medications:  ?  amLODipine (NORVASC) 10 MG tablet, Take 1 tablet (10 mg total) by mouth daily., Disp: 90 tablet, Rfl: 0 ?  ibuprofen (ADVIL,MOTRIN) 200 MG tablet, Take 200 mg by mouth every 6 (six) hours as needed for headache, mild pain or moderate pain., Disp: , Rfl:  ?  irbesartan (AVAPRO) 75 MG tablet, 1 tab po qd, Disp: 90 tablet, Rfl: 3 ? ?Allergies:  ?Allergies  ?Allergen Reactions  ? Bee Venom Swelling  ? Oxycodone Itching  ? Chlorthalidone Other (See Comments)  ?  Tinnitus and insomnia  ? ? ?Past Medical History, Surgical history, Social history, and Family History were reviewed and updated. ? ?Review of Systems: ?Review of Systems  ?Constitutional: Negative.   ?HENT:  Negative.    ?Eyes: Negative.   ?Respiratory: Negative.    ?Cardiovascular: Negative.   ?Gastrointestinal: Negative.   ?Endocrine: Negative.   ?Genitourinary:  Negative.    ?Musculoskeletal: Negative.   ?Skin: Negative.   ?Neurological: Negative.   ?Hematological: Negative.   ?Psychiatric/Behavioral: Negative.    ? ?Physical Exam: ? weight is 263 lb 1.9 oz (119.4 kg). His oral temperature is 98.5 ?F (36.9 ?C). His blood pressure is 128/86 and his pulse is 76. His respiration is 20 and oxygen saturation is 98%.  ? ?Wt Readings from Last 3 Encounters:  ?07/22/21 263 lb 1.9 oz (119.4 kg)  ?07/13/21 257 lb 3.2 oz (116.7 kg)  ?06/22/21 260 lb 6.4 oz (118.1 kg)  ? ? ?Physical Exam ?Vitals reviewed.  ?HENT:  ?   Head: Normocephalic and atraumatic.  ?Eyes:  ?   Pupils: Pupils are equal, round, and reactive to light.  ?Cardiovascular:  ?   Rate and Rhythm: Normal rate and regular rhythm.  ?   Heart sounds: Normal heart sounds.  ?Pulmonary:  ?   Effort: Pulmonary effort is normal.  ?   Breath sounds: Normal breath sounds.  ?Abdominal:  ?   General: Bowel sounds are normal.  ?   Palpations: Abdomen is soft.  ?Musculoskeletal:     ?   General: No tenderness or deformity. Normal range of motion.  ?   Cervical back: Normal range of motion.  ?Lymphadenopathy:  ?   Cervical: No cervical adenopathy.  ?Skin: ?   General: Skin is warm and dry.  ?  Findings: No erythema or rash.  ?Neurological:  ?   Mental Status: He is alert and oriented to person, place, and time.  ?Psychiatric:     ?   Behavior: Behavior normal.     ?   Thought Content: Thought content normal.     ?   Judgment: Judgment normal.  ? ? ? ?Lab Results  ?Component Value Date  ? WBC 7.9 07/22/2021  ? HGB 16.9 07/22/2021  ? HCT 48.4 07/22/2021  ? MCV 87.7 07/22/2021  ? PLT 231 07/22/2021  ? ?  Chemistry   ?   ?Component Value Date/Time  ? NA 137 07/22/2021 1304  ? K 4.1 07/22/2021 1304  ? CL 102 07/22/2021 1304  ? CO2 29 07/22/2021 1304  ? BUN 16 07/22/2021 1304  ? CREATININE 1.08 07/22/2021 1304  ? CREATININE 1.06 09/03/2018 0831  ?    ?Component Value Date/Time  ? CALCIUM 9.5 07/22/2021 1304  ? ALKPHOS 49 07/22/2021 1304  ?  AST 19 07/22/2021 1304  ? ALT 27 07/22/2021 1304  ? BILITOT 0.5 07/22/2021 1304  ?  ? ? ?Impression and Plan: ?Anthony Sharp is a very nice 31 year old white male.  He has hemochromatosis.  He says that he thinks that the hemochromatosis probably came from his father side of the family.  A cousin on the father side of the family also has hemochromatosis. ? ?Again, we will see what his iron levels look like.  I told him that he really needs to donate.  He can donate every 2-3 months. ? ?I think we can probably get him through the summertime now.  I would like to see him back after Labor Day. ? ? ?[name], MD ?5/5/20232:00 PM  ?

## 2021-08-23 ENCOUNTER — Encounter: Payer: Self-pay | Admitting: Family Medicine

## 2021-08-23 ENCOUNTER — Ambulatory Visit: Payer: BC Managed Care – PPO | Admitting: Family Medicine

## 2021-08-23 VITALS — BP 114/76 | HR 79 | Temp 98.7°F | Ht 71.75 in | Wt 258.4 lb

## 2021-08-23 DIAGNOSIS — I10 Essential (primary) hypertension: Secondary | ICD-10-CM

## 2021-08-23 LAB — BASIC METABOLIC PANEL
BUN: 14 mg/dL (ref 6–23)
CO2: 27 mEq/L (ref 19–32)
Calcium: 9.4 mg/dL (ref 8.4–10.5)
Chloride: 101 mEq/L (ref 96–112)
Creatinine, Ser: 1 mg/dL (ref 0.40–1.50)
GFR: 99.31 mL/min (ref >60.00)
Glucose, Bld: 77 mg/dL (ref 70–99)
Potassium: 4.3 mEq/L (ref 3.5–5.1)
Sodium: 136 mEq/L (ref 135–145)

## 2021-08-23 LAB — CBC
HCT: 49.5 % (ref 39.0–52.0)
Hemoglobin: 16.5 g/dL (ref 13.0–17.0)
MCHC: 33.3 g/dL (ref 30.0–36.0)
MCV: 90.4 fl (ref 78.0–100.0)
Platelets: 212 10*3/uL (ref 150.0–400.0)
RBC: 5.48 Mil/uL (ref 4.22–5.81)
RDW: 13.8 % (ref 11.5–15.5)
WBC: 6.7 10*3/uL (ref 4.0–10.5)

## 2021-08-23 MED ORDER — IRBESARTAN 75 MG PO TABS: ORAL_TABLET | ORAL | 3 refills | Status: DC

## 2021-08-23 MED ORDER — AMLODIPINE BESYLATE 10 MG PO TABS: 10.0000 mg | ORAL_TABLET | Freq: Every day | ORAL | 1 refills | Status: AC

## 2021-08-23 NOTE — Progress Notes (Signed)
OFFICE VISIT  08/23/2021  CC:  Chief Complaint  Patient presents with   Hypertension    Pt is not fasting   HPI:    Patient is a 31 y.o. male with genetic hemochromatosis who presents for f/u HTN. I last saw him about 6 wks ago. A/P as of that visit: "#1 nonspecific malaise, mild.  Unclear etiology.  Could be a result of cutting way back on caffeine recently. We will check his CBC and iron panel today as well as a c-Met.  We will forward results to his hematologist. In case his symptoms are result of borderline low blood pressure I have advised him to stop his irbesartan and just stay on amlodipine 10 mg a day for now and monitor blood pressure.  Would like him to be [phone] systolic over 37-85 diastolic."  INTERIM HX: Kristoffer is feeling well. Home blood pressures consistently around 115/75. He did try stopping the irbesartan for a little while but his blood pressure creeps back up so he restarted it within the last week and things got back to normal. Energy level is fine.  Last couple days he has had a headache on the top of his head, which is unusual for him.  It is abating today with use of ibuprofen.  Otherwise no problem with headaches.  No facial flushing. [facility]  ROS as above, plus--> no fevers, no CP, no SOB, no wheezing, no cough, no dizziness, no rashes, no melena/hematochezia.  No polyuria or polydipsia.  No myalgias or arthralgias.  No focal weakness, paresthesias, or tremors.  No acute vision or hearing abnormalities.  No dysuria or unusual/new urinary urgency or frequency.  No recent changes in lower legs. No n/v/d or abd pain.  No palpitations.    Past Medical History:  Diagnosis Date   Anxiety    Erythrocytosis    elev transferrin sat   Goals of care, counseling/discussion 04/25/2021   Hemochromatosis, hereditary ([place]) 04/25/2021   Hypertension    Obesity, Class II, BMI 35-39.9     Past Surgical History:  Procedure  Laterality Date   BACK SURGERY  2018   L5, S1    Outpatient Medications Prior to Visit  Medication Sig Dispense Refill   amLODipine (NORVASC) 10 MG tablet Take 1 tablet (10 mg total) by mouth daily. 90 tablet 0   ibuprofen (ADVIL,MOTRIN) 200 MG tablet Take 200 mg by mouth every 6 (six) hours as needed for headache, mild pain or moderate pain.     irbesartan (AVAPRO) 75 MG tablet 1 tab po qd 90 tablet 3   No facility-administered medications prior to visit.    Allergies  Allergen Reactions   Bee Venom Swelling   Oxycodone Itching   Chlorthalidone Other (See Comments)    Tinnitus and insomnia    ROS As per HPI  PE:    08/23/2021    8:05 AM 07/22/2021    1:29 PM 07/13/2021    9:39 AM  Vitals with BMI  Height 5' 11.75"  5' 11.75"  Weight 258 lbs 6 oz 263 lbs 2 oz 257 lbs 3 oz  BMI 35.31 88.50 27.74  Systolic 128 786 767  Diastolic 76 86 73  Pulse 79 76 86     Physical Exam  Gen: Alert, well appearing.  Patient is oriented to person, place, time, and situation. AFFECT: pleasant, lucid thought and speech. CV: RRR, no m/r/g.   LUNGS: CTA bilat, nonlabored resps, good  aeration in all lung fields. EXT: no clubbing or cyanosis.  no edema.  No rubor, pallor, or icterus.  LABS:  Last CBC Lab Results  Component Value Date   WBC 7.9 07/22/2021   HGB 16.9 07/22/2021   HCT 48.4 07/22/2021   MCV 87.7 07/22/2021   MCH 30.6 07/22/2021   RDW 12.2 07/22/2021   PLT 231 07/22/2021   Lab Results  Component Value Date   IRON 182 07/22/2021   TIBC 354 07/22/2021   FERRITIN 39 63/89/3734   Last metabolic panel Lab Results  Component Value Date   GLUCOSE 105 (H) 07/22/2021   NA 137 07/22/2021   K 4.1 07/22/2021   CL 102 07/22/2021   CO2 29 07/22/2021   BUN 16 07/22/2021   CREATININE 1.08 07/22/2021   GFRNONAA >60 07/22/2021   CALCIUM 9.5 07/22/2021   PROT 7.0 07/22/2021   ALBUMIN 4.4 07/22/2021   BILITOT 0.5 07/22/2021   ALKPHOS 49 07/22/2021   AST 19 07/22/2021    ALT 27 07/22/2021   ANIONGAP 6 07/22/2021   Last lipids Lab Results  Component Value Date   CHOL 220 (H) 01/31/2021   HDL 54.50 01/31/2021   LDLCALC 128 (H) 01/31/2021   TRIG 188.0 (H) 01/31/2021   CHOLHDL 4 01/31/2021   Last thyroid functions Lab Results  Component Value Date   TSH 1.93 01/31/2021   IMPRESSION AND PLAN:  1 Essential hypertension, well controlled.  Continue amlodipine 10 mg/day and irbesartan 75 mg/day. Electrolytes and creatinine today.  2.  Hereditary hemochromatosis. Most recent blood donation at [facility] was about 3 weeks ago. Per Dr. Marin Olp his ferritin goal is less than 100 and iron saturation goal is less than 30%. CBC and iron panel today.  We will forward results to Dr. Marin Olp.  An After Visit Summary was printed and given to the patient.  FOLLOW UP: No follow-ups on file. Next cpe 01/2023  Signed:  Crissie Sickles, MD           08/23/2021

## 2021-08-24 LAB — IRON,TIBC AND FERRITIN PANEL
%SAT: 45 % (calc) (ref 20–48)
Ferritin: 30 ng/mL — ABNORMAL LOW (ref 38–380)
Iron: 174 ug/dL (ref 50–180)
TIBC: 389 mcg/dL (calc) (ref 250–425)

## 2021-09-01 ENCOUNTER — Other Ambulatory Visit: Payer: Self-pay | Admitting: Family Medicine

## 2021-09-01 ENCOUNTER — Ambulatory Visit: Payer: Self-pay

## 2021-09-01 DIAGNOSIS — M5441 Lumbago with sciatica, right side: Secondary | ICD-10-CM

## 2021-11-24 ENCOUNTER — Inpatient Hospital Stay: Payer: BC Managed Care – PPO | Attending: Hematology & Oncology

## 2021-11-24 ENCOUNTER — Inpatient Hospital Stay: Payer: BC Managed Care – PPO | Admitting: Hematology & Oncology

## 2021-11-30 ENCOUNTER — Ambulatory Visit: Payer: BC Managed Care – PPO | Admitting: Family Medicine

## 2021-11-30 NOTE — Progress Notes (Deleted)
OFFICE VISIT  11/30/2021  CC: No chief complaint on file.   Patient is a 31 y.o. male who presents for 17-monthfollow-up hypertension and hemochromatosis. A/P as of last visit: "1 Essential hypertension, well controlled.  Continue amlodipine 10 mg/day and irbesartan 75 mg/day. Electrolytes and creatinine today.  2.  Hered[facility] hemochromatosis. [facility].  Needs to do this every 2 to 3 months. Per Dr. EMarin Olphis ferritin goal is less than 100 and iron saturation goal is less than 30%. CBC and iron panel today.  We will forward results to Dr. EMarin Olp"  INTERIM HX: ***   Past Medical History:  Diagnosis Date   Anxiety    Erythrocytosis    elev transferrin sat   Goals of care, counseling/discussion 04/25/2021   [place] 04/25[place]   Hypertension    Obesity, Class II, BMI 35-39.9     Past Surgical History:  Procedure Laterality Date   BACK SURGERY  2018   L5, S1    Outpatient Medications Prior to Visit  Medication Sig Dispense Refill   amLODipine (NORVASC) 10 MG tablet Take 1 tablet (10 mg total) by mouth daily. 90 tablet 1   ibuprofen (ADVIL,MOTRIN) 200 MG tablet Take 200 mg by mouth every 6 (six) hours as needed for headache, mild pain or moderate pain.     irbesartan (AVAPRO) 75 MG tablet 1 tab po qd 90 tablet 3   No facility-administered medications prior to visit.    Allergies  Allergen Reactions   Bee Venom Swelling   Oxycodone Itching   Chlorthalidone Other (See Comments)    Tinnitus and insomnia    ROS As per HPI  PE:    08/23/2021    8:05 AM 07/22/2021    1:29 PM 07/13/2021    9:39 AM  Vitals with BMI  Height 5' 11.75"  5' 11.75"  Weight 258 lbs 6 oz 263 lbs 2 oz 257 lbs 3 oz  BMI 35.31 327.06323.76 Systolic [id number] Diastolic 76 86 73  Pulse 79 76 86     Physical Exam  ***  LABS:  Last CBC Lab Results  Component Value Date   WBC 6.7 08/23/2021   HGB 16.5  08/23/2021   HCT 49.5 08/23/2021   MCV 90.4 08/23/2021   MCH 30.6 07/22/2021   RDW 13.8 08/23/2021   PLT 212.0 060/73/7106  Last metabolic panel Lab Results  Component Value Date   GLUCOSE 77 08/23/2021   NA 136 08/23/2021   K 4.3 08/23/2021   CL 101 08/23/2021   CO2 27 08/23/2021   BUN 14 08/23/2021   CREATININE 1.00 08/23/2021   GFRNONAA >60 07/22/2021   CALCIUM 9.4 08/23/2021   PROT 7.0 07/22/2021   ALBUMIN 4.4 07/22/2021   BILITOT 0.5 07/22/2021   ALKPHOS 49 07/22/2021   AST 19 07/22/2021   ALT 27 07/22/2021   ANIONGAP 6 07/22/2021   Last lipids Lab Results  Component Value Date   CHOL 220 (H) 01/31/2021   HDL 54.50 01/31/2021   LDLCALC 128 (H) 01/31/2021   TRIG 188.0 (H) 01/31/2021   CHOLHDL 4 01/31/2021   Last thyroid functions Lab Results  Component Value Date   TSH 1.93 01/31/2021   IMPRESSION AND PLAN:  No problem-specific Assessment & Plan notes found for this encounter.   An After Visit Summary was printed and given to the patient.  FOLLOW UP: No follow-ups on file.  Signed:  Crissie Sickles, MD           11/30/2021

## 2021-12-02 ENCOUNTER — Encounter: Payer: Self-pay | Admitting: Family Medicine

## 2022-01-20 ENCOUNTER — Encounter: Payer: Self-pay | Admitting: Family Medicine

## 2022-02-20 ENCOUNTER — Ambulatory Visit: Payer: BC Managed Care – PPO | Admitting: Dermatology

## 2022-06-13 ENCOUNTER — Encounter: Payer: Self-pay | Admitting: Family Medicine

## 2022-06-14 ENCOUNTER — Encounter: Payer: Self-pay | Admitting: Orthopedic Surgery

## 2022-06-19 ENCOUNTER — Other Ambulatory Visit: Payer: Self-pay

## 2022-06-19 ENCOUNTER — Emergency Department (HOSPITAL_COMMUNITY): Payer: Medicaid Other

## 2022-06-19 ENCOUNTER — Emergency Department (HOSPITAL_COMMUNITY)
Admission: EM | Admit: 2022-06-19 | Discharge: 2022-06-19 | Disposition: A | Discharge status: Home / Self Care | Payer: Medicaid Other | Attending: Student | Admitting: Student

## 2022-06-19 DIAGNOSIS — R079 Chest pain, unspecified: Secondary | ICD-10-CM | POA: Insufficient documentation

## 2022-06-19 DIAGNOSIS — F410 Panic disorder [episodic paroxysmal anxiety] without agoraphobia: Secondary | ICD-10-CM | POA: Diagnosis not present

## 2022-06-19 LAB — CBC WITH DIFFERENTIAL/PLATELET
Abs Immature Granulocytes: 0.02 10*3/uL (ref 0.00–0.07)
Basophils Absolute: 0.1 10*3/uL (ref 0.0–0.1)
Basophils Relative: 1 %
Eosinophils Absolute: 0.4 10*3/uL (ref 0.0–0.5)
Eosinophils Relative: 6 %
HCT: 47 % (ref 39.0–52.0)
Hemoglobin: 16.1 g/dL (ref 13.0–17.0)
Immature Granulocytes: 0 %
Lymphocytes Relative: 18 %
Lymphs Abs: 1 10*3/uL (ref 0.7–4.0)
MCH: 29.9 pg (ref 26.0–34.0)
MCHC: 34.3 g/dL (ref 30.0–36.0)
MCV: 87.4 fL (ref 80.0–100.0)
Monocytes Absolute: 0.4 10*3/uL (ref 0.1–1.0)
Monocytes Relative: 6 %
Neutro Abs: 3.9 10*3/uL (ref 1.7–7.7)
Neutrophils Relative %: 69 %
Platelets: 193 10*3/uL (ref 150–400)
RBC: 5.38 MIL/uL (ref 4.22–5.81)
RDW: 11.9 % (ref 11.5–15.5)
WBC: 5.8 10*3/uL (ref 4.0–10.5)
nRBC: 0 % (ref 0.0–0.2)

## 2022-06-19 LAB — COMPREHENSIVE METABOLIC PANEL
ALT: 27 U/L (ref 0–44)
AST: 20 U/L (ref 15–41)
Albumin: 3.9 g/dL (ref 3.5–5.0)
Alkaline Phosphatase: 51 U/L (ref 38–126)
Anion gap: 7 (ref 5–15)
BUN: 19 mg/dL (ref 6–20)
CO2: 21 mmol/L — ABNORMAL LOW (ref 22–32)
Calcium: 8.5 mg/dL — ABNORMAL LOW (ref 8.9–10.3)
Chloride: 105 mmol/L (ref 98–111)
Creatinine, Ser: 1.24 mg/dL (ref 0.61–1.24)
GFR, Estimated: >60 mL/min (ref >60)
Glucose, Bld: 186 mg/dL — ABNORMAL HIGH (ref 70–99)
Potassium: 3.5 mmol/L (ref 3.5–5.1)
Sodium: 133 mmol/L — ABNORMAL LOW (ref 135–145)
Total Bilirubin: 0.6 mg/dL (ref 0.3–1.2)
Total Protein: 6.9 g/dL (ref 6.5–8.1)

## 2022-06-19 LAB — TROPONIN I (HIGH SENSITIVITY)
Troponin I (High Sensitivity): 3 ng/L (ref <18)
Troponin I (High Sensitivity): 4 ng/L (ref <18)

## 2022-06-19 MED ORDER — LORAZEPAM 1 MG PO TABS: 1.0000 mg | ORAL_TABLET | Freq: Three times a day (TID) | ORAL | 0 refills | Status: DC | PRN

## 2022-06-19 MED ORDER — HYDROXYZINE HCL 25 MG PO TABS
50.0000 mg | ORAL_TABLET | Freq: Once | ORAL | Status: AC
  Administered 2022-06-19: 50 mg via ORAL
  Filled 2022-06-19: qty 2

## 2022-06-19 MED ORDER — LACTATED RINGERS IV BOLUS
1000.0000 mL | Freq: Once | INTRAVENOUS | Status: AC
  Administered 2022-06-19: 1000 mL via INTRAVENOUS

## 2022-06-19 MED ORDER — HYDROXYZINE HCL 50 MG/ML IM SOLN: 50.0000 mg | Freq: Once | INTRAMUSCULAR | Status: DC

## 2022-06-19 MED ORDER — LORAZEPAM 1 MG PO TABS
2.0000 mg | ORAL_TABLET | Freq: Once | ORAL | Status: AC
  Administered 2022-06-19: 2 mg via ORAL
  Filled 2022-06-19: qty 2

## 2022-06-19 NOTE — Discharge Instructions (Addendum)
You were seen in the emergency department for anxiety. Your anxiety was improved with a dose of Ativan and Hydroxyzine. I have sent a prescription for severe breakthrough anxiety that you can take if needed at home. Please plan on following up with your primary care provider for further evaluation and discussing potentially starting a daily medication for anxiety.

## 2022-06-19 NOTE — ED Triage Notes (Signed)
Pt states this is the worse panic attack he has ever had. Unable to stay still and states he is going fall out

## 2022-06-19 NOTE — ED Provider Notes (Signed)
[place] EMERGENCY DEPARTMENT AT [facility] Provider Note   CSN: [id number] Arrival date & time: 06/19/22  1925     History Chief Complaint  Patient presents with   Panic Attack    Anthony Sharp is a 32 y.o. male.  Patient presents to the emergency department complaints of a panic attack.  He reports a prior history of anxiety but not currently take any medications to manage this. He states that he does not know what precipitated his symptoms but that he felt that his anxiety suddenly escalated. He became more concerned as he began to experience some chest pain with this. No prior history of cardiac abnormalities.  HPI     Home Medications Prior to Admission medications   Medication Sig Start Date End Date Taking? Authorizing Provider  LORazepam (ATIVAN) 1 MG tablet Take 1 tablet (1 mg total) by mouth 3 (three) times daily as needed for anxiety. 06/19/22  Yes Maryanna Shape A, PA-C  amLODipine (NORVASC) 10 MG tablet Take 1 tablet (10 mg total) by mouth daily. 08/23/21   McGowen, Maryjean Morn, MD  ibuprofen (ADVIL,MOTRIN) 200 MG tablet Take 200 mg by mouth every 6 (six) hours as needed for headache, mild pain or moderate pain.    [provider]  irbesartan (AVAPRO) 75 MG tablet 1 tab po qd 08/23/21   McGowen, Maryjean Morn, MD      Allergies    Bee venom, Oxycodone, and Chlorthalidone    Review of Systems   Review of Systems  Psychiatric/Behavioral:  The patient is nervous/anxious.   All other systems reviewed and are negative.   Physical Exam Updated Vital Signs BP (!) 141/99   Pulse 88   Temp 98.6 F (37 C) (Oral)   Resp (!) 25   Ht 6' (1.829 m)   Wt 117.9 kg   SpO2 97%   BMI 35.26 kg/m  Physical Exam Vitals and nursing note reviewed.  Constitutional:      General: He is not in acute distress.    Appearance: He is well-developed.  HENT:     Head: Normocephalic and atraumatic.  Eyes:     Conjunctiva/sclera: Conjunctivae normal.  Cardiovascular:      Rate and Rhythm: Normal rate and regular rhythm.     Heart sounds: No murmur heard. Pulmonary:     Effort: Pulmonary effort is normal. No respiratory distress.     Breath sounds: Normal breath sounds.  Abdominal:     Palpations: Abdomen is soft.     Tenderness: There is no abdominal tenderness.  Musculoskeletal:        General: No swelling.     Cervical back: Neck supple.  Skin:    General: Skin is warm and dry.     Capillary Refill: Capillary refill takes less than 2 seconds.  Neurological:     Mental Status: He is alert.  Psychiatric:        Attention and Perception: Attention and perception normal.        Mood and Affect: Mood is anxious.     ED Results / Procedures / Treatments   Labs (all labs ordered are listed, but only abnormal results are displayed) Labs Reviewed  COMPREHENSIVE METABOLIC PANEL - Abnormal; Notable for the following components:      Result Value   Sodium 133 (*)    CO2 21 (*)    Glucose, Bld 186 (*)    Calcium 8.5 (*)    All other components within normal limits  CBC WITH DIFFERENTIAL/PLATELET  TROPONIN I (HIGH SENSITIVITY)  TROPONIN I (HIGH SENSITIVITY)    EKG None  Radiology DG Chest Portable 1 View  Result Date: 06/19/2022 CLINICAL DATA:  Chest pain EXAM: PORTABLE CHEST 1 VIEW COMPARISON:  12/07/2015 FINDINGS: The heart size and mediastinal contours are within normal limits. Both lungs are clear. The visualized skeletal structures are unremarkable. IMPRESSION: Normal study. Electronically Signed   By: Charlett Nose M.D.   On: 06/19/2022 21:08    Procedures Procedures   Medications Ordered in ED Medications  LORazepam (ATIVAN) tablet 2 mg (2 mg Oral Given 06/19/22 2035)  lactated ringers bolus 1,000 mL (0 mLs Intravenous Stopped 06/19/22 2258)  hydrOXYzine (ATARAX) tablet 50 mg (50 mg Oral Given 06/19/22 2138)    ED Course/ Medical Decision Making/ A&P                           Medical Decision Making Amount and/or Complexity of Data  Reviewed Labs: ordered. Radiology: ordered.  Risk Prescription drug management.   This patient presents to the ED for concern of panic attack.  Differential diagnosis includes anxiety, depression, psychosis, schizoaffective disorder, bipolar disorder   Lab Tests:  I Ordered, and personally interpreted labs.  The pertinent results include: Normal CBC, CMP with evidence of minor dehydration with hyponatremia at 133, negative troponin   Imaging Studies ordered:  I ordered imaging studies including chest x-ray I independently visualized and interpreted imaging which showed no acute cardiopulmonary findings I agree with the radiologist interpretation   Medicines ordered and prescription drug management:  I ordered medication including fluid, Ativan, hydroxyzine for hydration, anxiety Reevaluation of the patient after these medicines showed that the patient improved I have reviewed the patients home medicines and have made adjustments as needed   Problem List / ED Course:  Patient presented to the ED for concerns of a panic attack. He reports that this began earlier today without a particular trigger. He has known anxiety but has not been medically managed or seeing a therapist for this. Given presentation with chest pain, cardiac workup initiated which was thankfully normal except for some tachycardia as noted on EKG. Otherwise patient appears stable. Dose of Ativan and hydroxyzine given to patient which did improve his symptoms considerably and tachycardia resolved. Short course of Ativan sent to patient's pharmacy so patient can take for severe anxiety until he can be evaluated by psychiatry/PCP for better psychiatric management.  Final Clinical Impression(s) / ED Diagnoses Final diagnoses:  Panic attack    Rx / DC Orders ED Discharge Orders          Ordered    LORazepam (ATIVAN) 1 MG tablet  3 times daily PRN        06/19/22 2252              Smitty Knudsen,  PA-C 06/20/22 2319    Glendora Score, MD 06/23/22 2223

## 2022-06-21 ENCOUNTER — Telehealth: Payer: Self-pay | Admitting: Emergency Medicine

## 2022-06-21 NOTE — Telephone Encounter (Signed)
Pt significant other called and inquired about potentially getting a refill for ativan px that was received in ED and reports pt is in between pcp offices. Consulted provider and reported to discuss with pt/pt family that we will see patient but that ativan is not able to be prescribed at [facility] and that alternative such as hydroxyzine may be recommended.   Pt significant other inquired about difference between hydroxyzine and ativan and if any other possible treatment options to get ativan refilled. Discussed option to schedule new pt visit with other pcp to see if could get in with another office sooner. No other questions at this time. NP aware.

## 2022-06-23 ENCOUNTER — Other Ambulatory Visit: Payer: Self-pay | Admitting: Orthopedic Surgery

## 2022-06-23 DIAGNOSIS — M5459 Other low back pain: Secondary | ICD-10-CM

## 2022-06-25 ENCOUNTER — Ambulatory Visit
Admission: RE | Admit: 2022-06-25 | Discharge: 2022-06-25 | Disposition: A | Discharge status: Home / Self Care | Payer: Medicaid Other | Source: Ambulatory Visit | Attending: Emergency Medicine | Admitting: Emergency Medicine

## 2022-06-25 VITALS — BP 129/91 | HR 91 | Temp 98.7°F | Resp 20

## 2022-06-25 DIAGNOSIS — F411 Generalized anxiety disorder: Secondary | ICD-10-CM | POA: Diagnosis not present

## 2022-06-25 MED ORDER — HYDROXYZINE HCL 25 MG PO TABS: 25.0000 mg | ORAL_TABLET | Freq: Three times a day (TID) | ORAL | 0 refills | Status: DC | PRN

## 2022-06-25 NOTE — Discharge Instructions (Signed)
Today we have discussed her anxiety  You have been given hydroxyzine which she may use every 8 hours as needed to manage her anxiety if you run out of your Ativan, these medicines works similarly but hydroxyzine does not have the dependency factor that Ativan does as well as it is in a different drug class, hydroxyzine does work well for most  Please keep your upcoming primary care appointment for further evaluation and management  At any point if your anxiety becomes to a heightened state of concern you may go to the nearest emergency department or you may go to the behavioral health center in [place] for further evaluation and management, information is listed on front page  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

## 2022-06-25 NOTE — ED Triage Notes (Signed)
Pt reports he is having some anxiety and due to his previous attacks he need ore meds.

## 2022-06-25 NOTE — ED Provider Notes (Signed)
[facility]    CSN: [id number] Arrival date & time: 06/25/22  1250      History   Chief Complaint Chief Complaint  Patient presents with   Anxiety    Visit from being seen in Er, just need medication until my first appt may 3rd with pcp - Entered by patient    HPI Anthony Sharp is a 32 y.o. male.   Patient presents for evaluation of anxiety.  Endorses he has a history of anxiety and panic attacks.  7 days ago he experienced the worst panic attack that he had ever felt, different than baseline.  Symptoms occurred without trigger.  Symptoms lasted for at least 6 hours and compared to the normal 2 to 3 minutes before spontaneous resolution.  Had associated difficulty breathing, chest pain and feeling as if he was going to pass out.  Was evaluated in the emergency department that day to rule out cardiac involvement, all testing negative.  Was prescribed Ativan which he has been using as needed, taking at least 1 pill daily, has been helpful.  Has 6 days left and is requesting additional medication for management.  Has upcoming PCP appointment to establish care on Jul 21, 2022.  History of  hemochromatosis .  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  He is s/p ortho eval->L4-5 and L5-S1 ESI no help.  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 Authorizing Provider  amLODipine (NORVASC) 10 MG tablet Take 1 tablet (10 mg total) by mouth daily. 08/23/21   McGowen, Maryjean Morn, MD  ibuprofen (ADVIL,MOTRIN) 200 MG tablet Take 200 mg by mouth every 6 (six) hours as needed for headache, mild pain or moderate pain.    [provider]  irbesartan (AVAPRO) 75 MG tablet 1 tab po qd 08/23/21   McGowen, Maryjean Morn, MD  LORazepam (ATIVAN) 1 MG tablet Take 1 tablet (1 mg total) by mouth 3 (three) times daily as needed for anxiety. 06/19/22   Smitty Knudsen, PA-C    Family History Family History  Problem Relation Age of Onset   Hypertension Father    Hypertension Brother     Social History Social History   Tobacco Use   Smoking status: Every Day    Packs/day: 1    Types: Cigarettes   Smokeless tobacco: Current    Types: Snuff  Vaping Use   Vaping Use: Some days   Start date: 07/18/2020  Substance Use Topics   Alcohol use: Yes    Comment: socially   Drug use: No     Allergies   Bee venom, Oxycodone, and Chlorthalidone  Review of Systems Review of Systems   Physical Exam Triage Vital Signs ED Triage Vitals  Enc Vitals Group     BP 06/25/22 1304 (!) 129/91     Pulse Rate 06/25/22 1304 91     Resp 06/25/22 1304 20     Temp 06/25/22 1304 98.7 F (37.1 C)     Temp Source 06/25/22 1304 Oral     SpO2 06/25/22 1304 94 %     Weight --      Height --      Head Circumference --      Peak Flow --      Pain Score 06/25/22 1306 0     Pain Loc --      Pain Edu? --      Excl. in GC? --    No data found.  Updated Vital Signs BP (!) 129/91 (BP Location: Right Arm)   Pulse 91   Temp 98.7 F (37.1 C) (Oral)   Resp 20   SpO2 94%   Visual Acuity Right Eye Distance:   Left Eye Distance:   Bilateral Distance:    Right Eye Near:   Left Eye Near:    Bilateral Near:      Physical Exam Constitutional:      Appearance: Normal appearance.  HENT:     Head: Normocephalic.  Eyes:     Extraocular Movements: Extraocular movements intact.  Pulmonary:     Effort: Pulmonary effort is normal.  Neurological:     General: No focal deficit present.     Mental Status: He is alert and oriented to person, place, and time. Mental status is at baseline.  Psychiatric:        Mood and Affect: Mood normal.        Behavior: Behavior normal.      UC Treatments / Results  Labs (all labs ordered are listed, but only abnormal results are displayed) Labs Reviewed - No data to display  EKG   Radiology No results found.  Procedures Procedures (including critical care time)  Medications Ordered in UC Medications - No data to display  Initial Impression / Assessment and Plan / UC Course  I have reviewed the triage vital signs and the nursing notes.  Pertinent labs & imaging results that were available during my care of the patient were reviewed by me and considered in my medical decision making (see chart for details).  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   Valinda Hoar, NP 06/25/22 1324

## 2022-06-28 ENCOUNTER — Ambulatory Visit (HOSPITAL_COMMUNITY)
Admission: EM | Admit: 2022-06-28 | Discharge: 2022-06-28 | Disposition: A | Discharge status: Home / Self Care | Payer: Medicaid Other | Attending: Family | Admitting: Family

## 2022-06-28 DIAGNOSIS — F411 Generalized anxiety disorder: Secondary | ICD-10-CM | POA: Insufficient documentation

## 2022-06-28 NOTE — Discharge Instructions (Addendum)
Patient is instructed prior to discharge to:  Take all medications as prescribed by his/her mental healthcare provider. Report any adverse effects and or reactions from the medicines to his/her outpatient provider promptly. Keep all scheduled appointments, to ensure that you are getting refills on time and to avoid any interruption in your medication.  If you are unable to keep an appointment call to reschedule.  Be sure to follow-up with resources and follow-up appointments provided.  Patient has been instructed & cautioned: To not engage in alcohol and or illegal drug use while on prescription medicines. In the event of worsening symptoms, patient is instructed to call the crisis hotline, 911 and or go to the nearest ED for appropriate evaluation and treatment of symptoms. To follow-up with his/her primary care provider for your other medical issues, concerns and or health care needs.  Information: -National Suicide Prevention Lifeline 1-800-SUICIDE or 478-286-3720.  -988 offers 24/7 access to trained crisis counselors who can help people experiencing mental health-related distress. People can call or text 988 or chat 988lifeline.org for themselves or if they are worried about a loved one who may need crisis support.     Outpatient Services for Therapy and Medication Management for [facility] [address], [place], [address] [phone] phone  New Patient Assessment/Therapy Walk-ins Monday and Wednesday: 8am until slots are full. Every 1st and 2nd Friday: 1pm - 5pm  NO ASSESSMENT/THERAPY WALK-INS ON TUESDAYS OR THURSDAYS  New Patient Psychiatry/Medication Management Walk-ins Monday-Friday: 8am-11am  For all walk-ins, we ask that you arrive by 7:30am because patient will be seen in the order of arrival.  Availability is limited; therefore, you may not be seen on the same day that you walk-in.  Our goal is to serve and meet the needs of our  community to the best of our ability.   [facility] [address] [place], [place], [address] [phone] phone  [facility] [address] [place], [place], [address] [address] phone ([address], [employer] [address] - [place], [address], [place], [place], [employer], [address], BCBS Healthy [place], [place], [address], [place], [place], [place], [place], [employer], [employer], [employer], [place])  [facility] [address] [place], [place], [address] [phone] phone  [facility] [address], [address] [place], [place], [address] [phone] phone  [facility] [address] [place], [place], [address] [phone] phone  [facility] [address], [place], [address] [phone] phone  [facility], [place] [address], [place], [address] [phone] phone  [facility] [address] [place], [place], [address] [phone] phone [phone] fax  [facility] [address] [place], [place], [address] [phone] phone [phone] fax  [facility] [phone] [address] [place], [place], [address] [phone] phone  Jovita Kussmaul [address] [place], [place], [address]  [phone] phone  [facility]  (Adults Only) [address] [place], [place], [address]  [phone] phone [phone] fax    Base on the information you have provided and the presenting issue, outpatient services and resources for have been recommended.  It is imperative that you follow through with treatment recommendations within 5-7 days from the of discharge to mitigate further risk to your safety and mental well-being. A list of referrals has been provided below to get you started.  You are not  limited to the list provided.  In case of an urgent crisis,  you may contact the Mobile Crisis Unit with [facility] at [phone].     Therapists in [place], [place]   Dr. Linward Headland, Licensed Professional Counselor in [place], [place] Dr. Linward Headland Licensed Professional Counselor, PhD, LCMHC-S, ACS, CRC, [facility] Verified [place], [place] [address]  Are you navigating the challenges of Autism Spectrum Disorder (ASD) or seeking effective behavioral health interventions? Discover the transformative power of our unique approach, combining Applied Behavior Analysis (ABA) and Research-Based Behavioral Health Treatment (RB-BHT). At [facility], [facility], we focus on your needs, offering personalized care that sets [place] apart. We understand that everyone is different, and our approach reflects this commitment to personalization. Our holistic approach addresses behavioral challenges and social, communication, and adaptive skills. [phone]   [facility], Clinical Social Work/Therapist in [place], [place] [facility], [facility] Clinical Social Work/Therapist, [place], LCSWA Verified [place], [place] [address]  Therapy can often feel like unfamiliar territory even when we know we need help. I provide a calm, safe, judgment-free zone. I meet the client where they are. I believe that what we think and feel can affect our everyday lives. In addition to that, our physical and spiritual wellbeing can also affect our mental health. Whether this is your first time in therapy or you're familiar with the process, I'm here to take that leap of faith with you! [phone]   [facility], Licensed Clinical Mental Health Counselor in [place], [place] [facility] Licensed Clinical Mental Health Counselor, [facility] Verified [place], [place] [address]  Hello! I've worked in Print production planner for 30 years. I have helped children and adolescents to change negative behavior patterns to improve  social functioning. I've helped adults with various challenges to be successful in treatment. I've been a therapist for 7 years. My work and therapy experiences are such that I've seen/experienced or assisted many through some very difficult times. My goal will always be to empower, support, assist and help others to identify and overcome the barriers that they feel are standing in their path to a happier, healthier view of themselves and how they view others and the world around them. [phone]   Raymond Gurney, Licensed Clinical Mental Health Counselor in [place], [place] Raymond Gurney Licensed Clinical Mental Health Counselor, [place], [facility] Verified [address] [place], [place] [address]  We all strive to find our way in a difficult world. Yet many people find themselves feeling alone and misunderstood. I specialize in helping people with depression, anxiety, substance abuse, and legal issues. I work primarily with adults and adolescents and am a Merchandiser, retail. [phone]   Leighton Roach. Fredric Mare, Clinical Social Work/Therapist in [place], [place] [facility]. [facility] Clinical Social Work/Therapist, PhD, LCSW, CEAP Verified [place], [place] [address]  Waitlist for new clients I am an EQi- 2.0 certified professional who employs EMDR, and cognitive/behavioral techniques to offer clients concrete tools to address challenges. My research interests include emotional intelligence, enhancing relationship communication in both professional and personal circumstances. Additionally, I can assist with conflict resolution, and empowerment of individuals to achieve their full potential, both professionally and personally. [phone]   [facility], Clinical Social Work/Therapist in [place], [place] [facility] Clinical Social Work/Therapist, LCSW, ACSW, [place], [place], MAC Verified [place], [place]  [address]  Welcome! My name is Ova Freshwater and I am a licensed clinical social worker providing quality supportive services.  I am motivated to provide quality care to children, adolescents, and adults. I continue to work tirelessly to stay abreast of current trends as an out-patient therapist. I earned my Master's in Social Work at [employer] with a concentration in mental health and substance abuse. I have experience with working with a variety of addictions including but not limited to: sex, gambling, workaholics, substance abuse, and love/relationship. [phone]   [facility] [employer], [place]. in [place], [place] [facility] [employer], [place]. [place], [place] [address]  We are here to serve clients ages 62 - 61, who are challenged by a multitude of behavioral health concerns to include substance use disorders, and complex mental health scenarios where both medical and psychiatric conditions coexist. Moreover, as a trained Healthcare Chaplain, I seek to provide incarnational care. The aim of " [facility]" is to be incarnational. Therefore, to provide a holistic treatment approach to all who call upon our services. [phone]   Leeroy Cha, Clinical Social Work/Therapist in [place], [place] Jessica Zoar Clinical Social Work/Therapist, LCSW, LCAS, CCS Verified [place], [place] [address]  Every person I serve has a unique story. While some clients experience challenges in just one area, most experience difficulty multiple areas (mental health, trauma, loss, addiction), creating a complex, vicious cycle if not all parts are addressed. The circumstances that bring someone to therapy do not come in 'neat little packages.' I enjoy sorting out the pieces with each client, getting to the root of the issue, and offering a combination of evidence-based therapeutic interventions that help clients finally begin to  experience relief, and start living a happier, more fulfilled life. [phone]  Gevena Mart with [employer] [facility], Licensed Pharmacist, hospital in [place], [place] Marylene Land Wiley with [facility] Licensed Professional Counselor, LCMHCS, MAC, NCC, RDTBCT, BC-DMT Verified [address] [place], [place] [address]  I offer individual, family and group therapy. I specialize in using dance and drama therapy with individuals struggling with abuse, developmental/physical limitations, trauma and substance abuse. I offer a free telephone consultation. Each session has the opportunity to "move" instead of "sit" giving a chance to express feelings and emotions though your body. Achieving healing with mind and body. I can help those that have not had success with traditional therapies. During COVID-19 I will offer sessions on Zoom. [phone]    Beth Phineas Semen, Licensed Clinical Mental Health Counselor in [place], [place] Beth Colomb Kincaid Licensed Clinical Mental Health Counselor, [place], [facility], [facility] Verified [place], [place] [address]  I strive to provide a private, informal and comfortable office setting where my clients can expect to be treated with respect and dignity while facing life's challenges. I work with children, adolescents, adults, families, and couples. Some of my interests include: Self esteem, career/work, aging, stress and anger management, women's issues, eating disorders, addiction, conduct disorders, obsessive compulsive disorders, life coaching, assertiveness training, parenting and relationship skills, marital, pre-marital or divorce issues, depression, anxiety, grief/loss, dependency; emotional, sexual and physical abuse, trauma and ADHD. [phone]     [facility], Clinical Social Work/Therapist in [place], [place] [facility] Clinical Social Work/Therapist Verified [address] [place], [place] [address]  [facility] provides client-centered, therapeutic services for individuals seeking constructive solutions to regain daily, emotional stability. Key areas of focus are: sexual, emotional and physical abuse, childhood/adult trauma, depression, grief and loss, anxiety, anger management, relational problems, PTSD in addition to substance abuse  and other addictions. [facility] prides itself in providing a safe, welcoming environment for clients to engage in non-judgmental conversations under the trusted guidance of our licensed therapists. Clients have the benefit of exploring both our coaching and counseling services to ensure the most effective results. Ultimately, we listen to and assess each client's needs based on their unique set of experiences and desired goals, to provide them with the most appropriate pathway for progress. Located in the [place] of [place], [place] [facility] [address] is proud to serve clients in the [place], [address] [place], [place], [place], [place], and surrounding areas. [phone]   [facility], Clinical Social Work/Therapist in [place], [place] [facility], [facility] Clinical Social Work/Therapist, LCSW, MSW, LPN Verified [place], [place] [id number]  Karen's passion as a Child psychotherapistocial Worker is to uplift, inform, empower, and assist "All" clients with creating avenues for success, with a great desire to focus on lower socioeconomic and at-risk populations. [phone] View   [employer] McDonald, Clinical Social Work/Therapist in [place], [place] Whitney McDonald Clinical Social Work/Therapist, BSW, MSW, LCSWA Verified [place], [place] [id number]  (Online Only) [phone]   [facility], [facility], Licensed Clinical Mental Health Counselor in [place], [place] [facility], [facility] Licensed Clinical Mental Health  Counselor Verified [place], [place] [id number]  Not accepting new clients Located in the [place], [place], the founding principle of [facility] is to provide compassionate, competent and accessible counseling services to people of all ages, backgrounds and identities. The [facility] team consists of co-founders [facility] Wittenberg & Brock Ramily Taylor and counselor-in-training Evette Georgesasey Fowler. As of 11/18/2021 Pattricia BossAnnie & Irving Burtonmily have full caseloads. Baird LyonsCasey still has a few spots left. Please reach out for more details! [phone]   Photo of Mitzi Hansenobin Fitzgerald, Counselor in [place], [place] Mitzi Hansenobin Fitzgerald Counselor, [facility], DBS, NLP-P, MA Verified [place], [place] [phone]  Not accepting new clients Has the stress of life left you feeling overwhelmed? Do you have difficulty coping with day to day activities due to depression, anxiety, grief, or another emotional issue? Finding someone who can walk with you in this difficult time is an important first step towards feeling better. I can offer you a safe place filled with unconditional acceptance to share the difficulties that life has placed in your path. My passion is helping you move from depression, anxiety, grief, or low self- esteem into a person equipped to live your best life possible. [phone]

## 2022-06-28 NOTE — Progress Notes (Signed)
   06/28/22 1152  [facility] Triage Screening ([facility])  What Is the Reason for Your Visit/Call Today? Anthony Sharp is a 32 year old male [facility] with chief complaint of "horrible anxiety". Pt reports he was at  [facility] last week for anxiety and reports that it was the "worst attack he ever had" lasting about 5 hours and reports his HR was over 140 "I was fighting not to pass out". Pt reports the anxiety attack are random. Pt has a blood disorder of hemochromatosis and he thinks this is the cause of his anxiety. Pt was on xanax for about a week or two in the past. Pt reports he was given ativan in ED and it was working for him however he reports it made him zone out. Pt went to the urgent care in [facility] Sunday and they gave him hydroxyzine but reports it made him sleepy and gave him nightmares. Pt reports anxiety has been on and off for awhile but worsening over the past few months. No outpt services. Wants meds to help.  Took ativan 3am this morning. Pt denies SI, HI, AVH and substance use.  How Long Has This Been Causing You Problems? 1-6 months  Have You Recently Had Any Thoughts About Hurting Yourself? No  Are You Planning to Commit Suicide/Harm Yourself At This time? No  Have you Recently Had Thoughts About Hurting Someone [name]? No  Are You Planning To Harm Someone At This Time? No  Are you currently experiencing any auditory, visual or other hallucinations? No  Have You Used Any Alcohol or Drugs in the Past 24 Hours? No  Do you have any current medical co-morbidities that require immediate attention? No  Clinician description of patient physical appearance/behavior: calm  What Do You Feel Would Help You the Most Today? Treatment for Depression or other mood problem  If access to [facility] was not available, would you have sought care in the Emergency Department? No  Determination of Need Routine (7 days)  Options For Referral Medication Management;Outpatient Therapy

## 2022-06-28 NOTE — ED Provider Notes (Signed)
Behavioral Health Urgent Care Medical Screening Exam  Patient Name: Anthony Sharp MRN: [id number] Date of Evaluation: 06/28/22 Chief Complaint:   Diagnosis:  Final diagnoses:  GAD (generalized anxiety disorder)    History of Present illness: Anthony Sharp is a 32 y.o. male. Patient presents voluntarily to [facility] behavioral health for walk-in assessment.   Patient is assessed, face-to-face, by nurse practitioner. He is seated in assessment area, no acute distress. Consulted with provider, Dr.  Lucianne Muss, and chart reviewed on 06/28/2022. He  is alert and oriented, pleasant and cooperative during assessment.   Anthony Sharp is seeking outpatient psychiatry resources.  He reports increased anxiety worsening times several months.  He endorses "anxiety hits me out of nowhere, for example I was walking the dog last week and had a panic attack."  He was seen in the [facility] emergency department on 06/19/2022 after a panic attack.  He reports lorazepam was effective in treating anxiety at that time.  He has been prescribed hydroxyzine since being seen in emergency department reports his medication "made me sleepy and I felt like I may have had a nightmare but I cannot remember."  Patient endorses history of anxiety attacks that began in 2018.  In 2018 patient was treated with alprazolam, this medication was effective in treating anxiety.  Later transition to Zoloft however stopped Zoloft after "it made me feel not myself."  Previous anxiety medication management by primary care provider.  Patient would like to meet with outpatient psychiatry moving forward.  Anthony Sharp reports no history of outpatient psychiatry follow-up.  He denies history of inpatient psychiatric hospitalization.  No family mental health history reported.  Patient  presents with euthymic mood, congruent affect. He  denies suicidal and homicidal ideations. Denies history of suicide attempts, denies history of nonsuicidal self-harm.   Patient easily  contracts verbally for safety with this Clinical research associate. Patient has normal speech and behavior.  He  denies auditory and visual hallucinations.  Patient is able to converse coherently with goal-directed thoughts and no distractibility or preoccupation.  Denies symptoms of paranoia.  Objectively there is no evidence of psychosis/mania or delusional thinking.  Anthony Sharp resides in [place] with his mother and stepfather.  He denies access to weapons.  He is employed but currently out and receiving Worker's Compensation benefits after an injury at work.  Patient reports herniated disc in June 2023, treatment ongoing.  Patient endorses average sleep and appetite.  He endorses alcohol use approximately 1 episode every 2 months.  Typically consumes an average of 5 drinks during each episode of alcohol use.  He denies substance use aside from alcohol.  Patient offered support and encouragement. He gives verbal consent to speak with his mother, Gari Crown, phone number [phone]. Spoke with patient's mother who denies safety concerns. Confirms no access to weapons.    Patient and family are educated and verbalize understanding of mental health resources and other crisis services in the community. They are instructed to call 911 and present to the nearest emergency room should patient experience any suicidal/homicidal ideation, auditory/visual/hallucinations, or detrimental worsening of mental health condition.     Flowsheet Row ED from 06/28/2022 in [facility] ED from 06/25/2022 in [facility] at [facility] ED from 06/19/2022 in [facility] Emergency Department at [facility]  C-SSRS RISK CATEGORY No Risk No Risk No Risk       Psychiatric Specialty Exam  Presentation  General Appearance:Appropriate for Environment; Casual  Eye Contact:Good  Speech:Clear  and Coherent; Normal Rate  Speech Volume:Normal  Handedness:Right   Mood and  Affect  Mood: Euthymic  Affect: Appropriate; Congruent   Thought Process  Thought Processes: Coherent; Goal Directed; Linear  Descriptions of Associations:Intact  Orientation:Full (Time, Place and Person)  Thought Content:Logical; WDL    Hallucinations:None  Ideas of Reference:None  Suicidal Thoughts:No  Homicidal Thoughts:No   Sensorium  Memory: Immediate Good; Recent Good  Judgment: Good  Insight: Good   Executive Functions  Concentration: Good  Attention Span: Good  Recall: Good  Fund of Knowledge: Good  Language: Good   Psychomotor Activity  Psychomotor Activity: Normal   Assets  Assets: Communication Skills; Desire for Improvement; Housing; Intimacy; Financial Resources/Insurance; Resilience; Social Support   Sleep  Sleep: Fair  Number of hours: No data recorded  Physical Exam: Physical Exam Vitals and nursing note reviewed.  Constitutional:      Appearance: Normal appearance. He is well-developed and normal weight.  HENT:     Head: Normocephalic and atraumatic.  Cardiovascular:     Rate and Rhythm: Normal rate.  Pulmonary:     Effort: Pulmonary effort is normal.  Musculoskeletal:        General: Normal range of motion.  Skin:    General: Skin is warm and dry.  Neurological:     Mental Status: He is alert and oriented to person, place, and time.  Psychiatric:        Attention and Perception: Attention and perception normal.        Mood and Affect: Mood and affect normal.        Speech: Speech normal.        Behavior: Behavior normal. Behavior is cooperative.        Thought Content: Thought content normal.        Cognition and Memory: Cognition and memory normal.        Judgment: Judgment normal.    Review of Systems  Constitutional: Negative.   HENT: Negative.    Eyes: Negative.   Respiratory: Negative.    Cardiovascular: Negative.   Gastrointestinal: Negative.   Genitourinary: Negative.   Musculoskeletal:  Negative.   Skin: Negative.   Neurological: Negative.   Psychiatric/Behavioral: Negative.     Blood pressure (!) 136/101, pulse 85, temperature 98 F (36.7 C), temperature source Oral, resp. rate 18, SpO2 100 %. There is no height or weight on file to calculate BMI.  Musculoskeletal: Strength & Muscle Tone: within normal limits Gait & Station: normal Patient leans: N/A   [facility] MSE Discharge Disposition for Follow up and Recommendations: Based on my evaluation the patient does not appear to have an emergency medical condition and can be discharged with resources and follow up care in outpatient services for Medication Management and Individual Therapy Follow up with outpatient psychiatry, resources provided. Follow up with primary care provider. Continue current medications.    Lenard Lance, FNP 06/28/2022, 12:59 PM

## 2022-06-28 NOTE — Discharge Summary (Signed)
Frutoso Schatz to be D/C'd Home per NP order. An After Visit Summary was printed and given to the patient by provider. Patient escorted out and D/C home via private auto.  Dickie La  06/28/2022 2:04 PM

## 2022-07-06 ENCOUNTER — Ambulatory Visit
Admission: RE | Admit: 2022-07-06 | Discharge: 2022-07-06 | Disposition: A | Discharge status: Home / Self Care | Payer: Worker's Compensation | Source: Ambulatory Visit | Attending: Orthopedic Surgery | Admitting: Orthopedic Surgery

## 2022-07-06 DIAGNOSIS — M5459 Other low back pain: Secondary | ICD-10-CM

## 2022-07-25 ENCOUNTER — Other Ambulatory Visit: Payer: Self-pay

## 2022-07-25 ENCOUNTER — Emergency Department (HOSPITAL_COMMUNITY)
Admission: EM | Admit: 2022-07-25 | Discharge: 2022-07-25 | Disposition: A | Discharge status: Home / Self Care | Payer: Medicaid Other | Attending: Emergency Medicine | Admitting: Emergency Medicine

## 2022-07-25 ENCOUNTER — Emergency Department (HOSPITAL_COMMUNITY): Payer: Medicaid Other

## 2022-07-25 ENCOUNTER — Encounter (HOSPITAL_COMMUNITY): Payer: Self-pay | Admitting: Emergency Medicine

## 2022-07-25 DIAGNOSIS — K5732 Diverticulitis of large intestine without perforation or abscess without bleeding: Secondary | ICD-10-CM | POA: Diagnosis not present

## 2022-07-25 DIAGNOSIS — R109 Unspecified abdominal pain: Secondary | ICD-10-CM | POA: Diagnosis present

## 2022-07-25 DIAGNOSIS — F17210 Nicotine dependence, cigarettes, uncomplicated: Secondary | ICD-10-CM | POA: Insufficient documentation

## 2022-07-25 DIAGNOSIS — K5792 Diverticulitis of intestine, part unspecified, without perforation or abscess without bleeding: Secondary | ICD-10-CM

## 2022-07-25 LAB — CBC
HCT: 50.1 % (ref 39.0–52.0)
Hemoglobin: 16.8 g/dL (ref 13.0–17.0)
MCH: 28.9 pg (ref 26.0–34.0)
MCHC: 33.5 g/dL (ref 30.0–36.0)
MCV: 86.2 fL (ref 80.0–100.0)
Platelets: 228 10*3/uL (ref 150–400)
RBC: 5.81 MIL/uL (ref 4.22–5.81)
RDW: 12.3 % (ref 11.5–15.5)
WBC: 8.1 10*3/uL (ref 4.0–10.5)
nRBC: 0 % (ref 0.0–0.2)

## 2022-07-25 LAB — COMPREHENSIVE METABOLIC PANEL
ALT: 31 U/L (ref 0–44)
AST: 21 U/L (ref 15–41)
Albumin: 4.2 g/dL (ref 3.5–5.0)
Alkaline Phosphatase: 52 U/L (ref 38–126)
Anion gap: 8 (ref 5–15)
BUN: 14 mg/dL (ref 6–20)
CO2: 24 mmol/L (ref 22–32)
Calcium: 8.7 mg/dL — ABNORMAL LOW (ref 8.9–10.3)
Chloride: 101 mmol/L (ref 98–111)
Creatinine, Ser: 0.99 mg/dL (ref 0.61–1.24)
GFR, Estimated: >60 mL/min (ref >60)
Glucose, Bld: 101 mg/dL — ABNORMAL HIGH (ref 70–99)
Potassium: 3.9 mmol/L (ref 3.5–5.1)
Sodium: 133 mmol/L — ABNORMAL LOW (ref 135–145)
Total Bilirubin: 0.9 mg/dL (ref 0.3–1.2)
Total Protein: 7.5 g/dL (ref 6.5–8.1)

## 2022-07-25 LAB — URINALYSIS, ROUTINE W REFLEX MICROSCOPIC
Bilirubin Urine: NEGATIVE
Glucose, UA: NEGATIVE mg/dL
Hgb urine dipstick: NEGATIVE
Ketones, ur: NEGATIVE mg/dL
Leukocytes,Ua: NEGATIVE
Nitrite: NEGATIVE
Protein, ur: NEGATIVE mg/dL
Specific Gravity, Urine: 1.005 (ref 1.005–1.030)
pH: 7 (ref 5.0–8.0)

## 2022-07-25 LAB — LIPASE, BLOOD: Lipase: 41 U/L (ref 11–51)

## 2022-07-25 MED ORDER — SODIUM CHLORIDE 0.9 % IV BOLUS
500.0000 mL | Freq: Once | INTRAVENOUS | Status: AC
  Administered 2022-07-25: 500 mL via INTRAVENOUS

## 2022-07-25 MED ORDER — METHYLPREDNISOLONE SODIUM SUCC 125 MG IJ SOLR
125.0000 mg | Freq: Once | INTRAMUSCULAR | Status: AC
  Administered 2022-07-25: 125 mg via INTRAVENOUS
  Filled 2022-07-25: qty 2

## 2022-07-25 MED ORDER — DIPHENHYDRAMINE HCL 50 MG/ML IJ SOLN
25.0000 mg | Freq: Once | INTRAMUSCULAR | Status: AC
  Administered 2022-07-25: 25 mg via INTRAVENOUS
  Filled 2022-07-25: qty 1

## 2022-07-25 MED ORDER — CIPROFLOXACIN IN D5W 400 MG/200ML IV SOLN
400.0000 mg | Freq: Once | INTRAVENOUS | Status: AC
  Administered 2022-07-25: 400 mg via INTRAVENOUS
  Filled 2022-07-25: qty 200

## 2022-07-25 MED ORDER — METRONIDAZOLE 500 MG/100ML IV SOLN
500.0000 mg | Freq: Once | INTRAVENOUS | Status: AC
  Administered 2022-07-25: 500 mg via INTRAVENOUS
  Filled 2022-07-25: qty 100

## 2022-07-25 MED ORDER — CIPROFLOXACIN HCL 500 MG PO TABS: 500.0000 mg | ORAL_TABLET | Freq: Two times a day (BID) | ORAL | 0 refills | Status: DC

## 2022-07-25 MED ORDER — KETOROLAC TROMETHAMINE 30 MG/ML IJ SOLN
15.0000 mg | Freq: Once | INTRAMUSCULAR | Status: AC
  Administered 2022-07-25: 15 mg via INTRAVENOUS
  Filled 2022-07-25: qty 1

## 2022-07-25 MED ORDER — IOHEXOL 300 MG/ML  SOLN
100.0000 mL | Freq: Once | INTRAMUSCULAR | Status: AC | PRN
  Administered 2022-07-25: 100 mL via INTRAVENOUS

## 2022-07-25 MED ORDER — METRONIDAZOLE 500 MG PO TABS: 500.0000 mg | ORAL_TABLET | Freq: Two times a day (BID) | ORAL | 0 refills | Status: AC

## 2022-07-25 MED ORDER — ONDANSETRON 4 MG PO TBDP: 4.0000 mg | ORAL_TABLET | Freq: Three times a day (TID) | ORAL | 0 refills | Status: DC | PRN

## 2022-07-25 NOTE — Discharge Instructions (Addendum)
Diverticulitis typically improves with medications and plenty of fluids, with a bland diet over the next few days.  Please take medication as directed.  Return here for concerning changes in your condition.

## 2022-07-25 NOTE — ED Notes (Addendum)
Pt brought back from CT and CT tech reported pt had an allergic reaction to the IV dye. Pt has red, raised, itchy areas to neck, arms, chest, and legs. Pt denies any difficulty breathing. Dr. Jeraldine Loots was notified of pt's reaction by CT tech. Dr. Jeraldine Loots came to bedside to evaluate pt and gave orders to give Solumedrol and Benadryl. Medications administered. Vitals taken - BP 139/97, HR 70, RR 18, O2 sat 99% RA.

## 2022-07-25 NOTE — ED Triage Notes (Signed)
Pt via POV c/o Left-sided abdominal pain, tender to touch and worse with movement. No n/v/d, does not drink excessive ETOH, no prior abdominal concerns. OTC meds have not provided relief. HTN noted in triage 163/114

## 2022-07-25 NOTE — ED Provider Notes (Signed)
[place] EMERGENCY DEPARTMENT AT [facility] Provider Note   CSN: [id number] Arrival date & time: 07/25/22  1054     History  Chief Complaint  Patient presents with   Abdominal Pain    Anthony Sharp is a 32 y.o. male.  HPI Patient presents with his girlfriend who assist with the history.  He presents with a 1 day of left-sided abdominal pain, worse with palpation, motion. No skin changes, no precipitating, alleviating or exacerbating factors.   Back surgery, no abdominal surgery.  He smokes, does not drink substantially. No scrotal pain, urinary complaints, no vomiting, dyspnea, chest pain   Home Medications Prior to Admission medications   Medication Sig Start Date End Date Taking? Authorizing Provider  amLODipine (NORVASC) 10 MG tablet Take 1 tablet (10 mg total) by mouth daily. 08/23/21   McGowen, Maryjean Morn, MD  hydrOXYzine (ATARAX) 25 MG tablet Take 1 tablet (25 mg total) by mouth every 8 (eight) hours as needed. 06/25/22   White, Elita Boone, NP  ibuprofen (ADVIL,MOTRIN) 200 MG tablet Take 200 mg by mouth every 6 (six) hours as needed for headache, mild pain or moderate pain.    [provider]  irbesartan (AVAPRO) 75 MG tablet 1 tab po qd 08/23/21   McGowen, Maryjean Morn, MD  LORazepam (ATIVAN) 1 MG tablet Take 1 tablet (1 mg total) by mouth 3 (three) times daily as needed for anxiety. 06/19/22   Smitty Knudsen, PA-C      Allergies    Bee venom, Hydrocodone, and Chlorthalidone    Review of Systems   Review of Systems  All other systems reviewed and are negative.   Physical Exam Updated Vital Signs BP (!) 163/114 (BP Location: Right Arm)   Pulse 81   Temp 98.7 F (37.1 C) (Oral)   Resp 16   Ht 6' (1.829 m)   Wt 115.2 kg   SpO2 100%   BMI 34.45 kg/m  Physical Exam Vitals and nursing note reviewed.  Constitutional:      General: He is not in acute distress.    Appearance: He is well-developed.  HENT:     Head: Normocephalic and atraumatic.   Eyes:     Conjunctiva/sclera: Conjunctivae normal.  Pulmonary:     Effort: Pulmonary effort is normal. No respiratory distress.     Breath sounds: No stridor.  Abdominal:     General: There is no distension.     Tenderness: There is abdominal tenderness in the left lower quadrant. There is guarding.  Skin:    General: Skin is warm and dry.  Neurological:     Mental Status: He is alert and oriented to person, place, and time.     ED Results / Procedures / Treatments   Labs (all labs ordered are listed, but only abnormal results are displayed) Labs Reviewed  LIPASE, BLOOD  COMPREHENSIVE METABOLIC PANEL  CBC  URINALYSIS, ROUTINE W REFLEX MICROSCOPIC    EKG None  Radiology No results found.  Procedures Procedures    Medications Ordered in ED Medications  sodium chloride 0.9 % bolus 500 mL (has no administration in time range)  ketorolac (TORADOL) 30 MG/ML injection 15 mg (has no administration in time range)    ED Course/ Medical Decision Making/ A&P This patient with a Hx of back surgery, cigarette addiction presents to the ED for concern of sided abdominal pain, this involves an extensive number of treatment options, and is a complaint that carries with it a high  risk of complications and morbidity.    The differential diagnosis includes musculoskeletal strain, colitis, urinary tract disease, mass   Social Determinants of Health:  Cigarette addiction  Additional history obtained:  Additional history and/or information obtained from friend at bedside, notable for was included in HPI   After the initial evaluation, orders, including: CT labs Toradol monitoring fluids were initiated.   Patient placed on Cardiac and Pulse-Oximetry Monitors. The patient was maintained on a cardiac monitor.  The cardiac monitored showed an rhythm of 75 sinus normal The patient was also maintained on pulse oximetry. The readings were typically 100% room air normal   On repeat  evaluation of the patient improved On return from CT scan the patient did have rash, but no difficulty breathing, no facial swelling.  He received Solu-Medrol, Benadryl, had subsequent return to baseline. Lab Tests:  I personally interpreted labs.  The pertinent results include: Mild renal abnormalities  Imaging Studies ordered:  I independently visualized and interpreted imaging which showed diverticulitis I agree with the radiologist interpretation   Dispostion / Final MDM:  After consideration of the diagnostic results and the patient's response to treatment, this patient has received initial antibiotics here, after presenting with left lower quadrant abdominal pain, and with CT evidence for diverticulitis, will transition to oral antibiotic therapy, can follow-up with GI.  No evidence for perforation, bacteremia, sepsis.  Patient did have mild allergic reaction, possibly secondary to contrast dye, with complete resolution of those symptoms.   Final Clinical Impression(s) / ED Diagnoses Final diagnoses:  Diverticulitis    Rx / DC Orders ED Discharge Orders          Ordered    ciprofloxacin (CIPRO) 500 MG tablet  Every 12 hours        07/25/22 1423    metroNIDAZOLE (FLAGYL) 500 MG tablet  2 times daily        07/25/22 1423    ondansetron (ZOFRAN-ODT) 4 MG disintegrating tablet  Every 8 hours PRN        07/25/22 1423              Gerhard Munch, MD 07/25/22 1423

## 2022-07-31 ENCOUNTER — Other Ambulatory Visit: Payer: Self-pay | Admitting: Orthopedic Surgery

## 2022-07-31 DIAGNOSIS — M545 Low back pain, unspecified: Secondary | ICD-10-CM

## 2022-08-16 ENCOUNTER — Telehealth: Payer: Self-pay

## 2022-08-16 MED ORDER — DIPHENHYDRAMINE HCL 50 MG PO TABS: 50.0000 mg | ORAL_TABLET | Freq: Once | ORAL | 0 refills | Status: DC

## 2022-08-16 MED ORDER — PREDNISONE 50 MG PO TABS: ORAL_TABLET | ORAL | 0 refills | Status: DC

## 2022-08-16 NOTE — Telephone Encounter (Signed)
Phone call to patient to review instructions for 13 hr prep for CT guided SI Joint Injection on 09/05/22 at 8:00 AM. Prescription called into [facility]. Pt aware and verbalized understanding of instructions. Prescription: Pt to take 50 mg of prednisone on 09/04/22 at 7:00 PM, 50 mg of prednisone on 09/05/22 at 1:00 AM, and 50 mg of prednisone on 09/05/22 at 7:00 AM. Pt is also to take 50 mg of benadryl on 09/05/22 at 7:00 AM. Please call [phone] with any questions.   Benadryl was also called in as a prescription, at the patients request. Advised pt not to drive the day of taking this medication as it may cause drowsiness. Pt verbalized understanding.

## 2022-08-23 ENCOUNTER — Encounter: Payer: Self-pay | Admitting: Gastroenterology

## 2022-09-05 ENCOUNTER — Ambulatory Visit
Admission: RE | Admit: 2022-09-05 | Discharge: 2022-09-05 | Disposition: A | Discharge status: Home / Self Care | Payer: Medicaid Other | Source: Ambulatory Visit | Attending: Orthopedic Surgery | Admitting: Orthopedic Surgery

## 2022-09-05 DIAGNOSIS — M545 Low back pain, unspecified: Secondary | ICD-10-CM

## 2022-09-25 ENCOUNTER — Ambulatory Visit: Payer: Medicaid Other | Admitting: Gastroenterology

## 2022-11-14 ENCOUNTER — Emergency Department (HOSPITAL_COMMUNITY): Payer: Medicaid Other

## 2022-11-14 ENCOUNTER — Emergency Department (HOSPITAL_COMMUNITY)
Admission: EM | Admit: 2022-11-14 | Discharge: 2022-11-15 | Disposition: A | Discharge status: Home / Self Care | Payer: Medicaid Other | Attending: Emergency Medicine | Admitting: Emergency Medicine

## 2022-11-14 ENCOUNTER — Other Ambulatory Visit: Payer: Self-pay

## 2022-11-14 ENCOUNTER — Encounter (HOSPITAL_COMMUNITY): Payer: Self-pay | Admitting: Emergency Medicine

## 2022-11-14 DIAGNOSIS — Z79899 Other long term (current) drug therapy: Secondary | ICD-10-CM | POA: Diagnosis not present

## 2022-11-14 DIAGNOSIS — E876 Hypokalemia: Secondary | ICD-10-CM | POA: Diagnosis not present

## 2022-11-14 DIAGNOSIS — I10 Essential (primary) hypertension: Secondary | ICD-10-CM | POA: Diagnosis not present

## 2022-11-14 DIAGNOSIS — F17200 Nicotine dependence, unspecified, uncomplicated: Secondary | ICD-10-CM | POA: Insufficient documentation

## 2022-11-14 DIAGNOSIS — R7989 Other specified abnormal findings of blood chemistry: Secondary | ICD-10-CM

## 2022-11-14 DIAGNOSIS — R079 Chest pain, unspecified: Secondary | ICD-10-CM | POA: Diagnosis present

## 2022-11-14 LAB — CBC
HCT: 48.2 % (ref 39.0–52.0)
Hemoglobin: 15.9 g/dL (ref 13.0–17.0)
MCH: 27.4 pg (ref 26.0–34.0)
MCHC: 33 g/dL (ref 30.0–36.0)
MCV: 83 fL (ref 80.0–100.0)
Platelets: 238 10*3/uL (ref 150–400)
RBC: 5.81 MIL/uL (ref 4.22–5.81)
RDW: 13.5 % (ref 11.5–15.5)
WBC: 9.5 10*3/uL (ref 4.0–10.5)
nRBC: 0 % (ref 0.0–0.2)

## 2022-11-14 LAB — BASIC METABOLIC PANEL
Anion gap: 12 (ref 5–15)
BUN: 18 mg/dL (ref 6–20)
CO2: 24 mmol/L (ref 22–32)
Calcium: 9 mg/dL (ref 8.9–10.3)
Chloride: 100 mmol/L (ref 98–111)
Creatinine, Ser: 1.28 mg/dL — ABNORMAL HIGH (ref 0.61–1.24)
GFR, Estimated: >60 mL/min (ref >60)
Glucose, Bld: 113 mg/dL — ABNORMAL HIGH (ref 70–99)
Potassium: 3.2 mmol/L — ABNORMAL LOW (ref 3.5–5.1)
Sodium: 136 mmol/L (ref 135–145)

## 2022-11-14 LAB — TROPONIN I (HIGH SENSITIVITY)
Troponin I (High Sensitivity): 3 ng/L (ref <18)
Troponin I (High Sensitivity): 3 ng/L (ref <18)

## 2022-11-14 NOTE — ED Triage Notes (Signed)
Pt complains of left sided chest pain that started 30 mins ago while walking dog. Denies any SOB. Pt states he is very anxious because his father passed a few weeks ago from heart attack. Pt has history of anxiety and states this feels different.

## 2022-11-15 NOTE — ED Provider Notes (Signed)
[place] EMERGENCY DEPARTMENT AT [facility] Provider Note   CSN: [id number] Arrival date & time: 11/14/22  2044     History  Chief Complaint  Patient presents with   Chest Pain    Anthony Sharp is a 32 y.o. male.  The patient was walking his dog earlier tonight and had not 30-minute episode of intermittent episodes of tight anterior left chest pain just above his pectoral.  None since then.  He does have a history of anxiety states that after the pain started he started to get more more anxious about it which caused chest pressure and palpitations.  He recognized that his anxiety but initial chest pain was not consistent with his normal anxiety.  He states that one of the things that really worried him was that his father died recently of a heart attack at 34 years old.  Patient does smoke, hypertension, hyperlipidemia and obesity.  No diabetes.  He had no associated shortness of breath, nausea, lightheadedness or other symptoms.   Chest Pain      Home Medications Prior to Admission medications   Medication Sig Start Date End Date Taking? Authorizing Provider  amLODipine (NORVASC) 10 MG tablet Take 1 tablet (10 mg total) by mouth daily. 08/23/21   McGowen, Maryjean Morn, MD  ciprofloxacin (CIPRO) 500 MG tablet Take 1 tablet (500 mg total) by mouth every 12 (twelve) hours. 07/25/22   Gerhard Munch, MD  diphenhydrAMINE (BENADRYL) 50 MG tablet Take 1 tablet (50 mg total) by mouth once for 1 dose. Pt to take 50 mg of benadryl on 09/05/22 at 7:00 AM. Please call [phone] with any questions. 08/16/22 08/16/22  Paulina Fusi, MD  hydrOXYzine (ATARAX) 25 MG tablet Take 1 tablet (25 mg total) by mouth every 8 (eight) hours as needed. 06/25/22   White, Elita Boone, NP  ibuprofen (ADVIL,MOTRIN) 200 MG tablet Take 200 mg by mouth every 6 (six) hours as needed for headache, mild pain or moderate pain.    [provider]  irbesartan (AVAPRO) 75 MG tablet 1 tab po qd 08/23/21    McGowen, Maryjean Morn, MD  LORazepam (ATIVAN) 1 MG tablet Take 1 tablet (1 mg total) by mouth 3 (three) times daily as needed for anxiety. 06/19/22   Smitty Knudsen, PA-C  ondansetron (ZOFRAN-ODT) 4 MG disintegrating tablet Take 1 tablet (4 mg total) by mouth every 8 (eight) hours as needed for nausea or vomiting. 07/25/22   Gerhard Munch, MD  predniSONE (DELTASONE) 50 MG tablet Pt to take 50 mg of prednisone on 09/04/22 at 7:00 PM, 50 mg of prednisone on 09/05/22 at 1:00 AM, and 50 mg of prednisone on 09/05/22 at 7:00 AM. Pt is also to take 50 mg of benadryl on 09/05/22 at 7:00 AM. Please call [phone] with any questions. 08/16/22   Paulina Fusi, MD      Allergies    Iohexol, Bee venom, Hydrocodone, and Chlorthalidone    Review of Systems   Review of Systems  Cardiovascular:  Positive for chest pain.    Physical Exam Updated Vital Signs BP 123/89   Pulse 82   Temp 98.8 F (37.1 C)   Resp 18   Ht 6' (1.829 m)   Wt 115.7 kg   SpO2 96%   BMI 34.58 kg/m  Physical Exam Vitals and nursing note reviewed.  Constitutional:      Appearance: He is well-developed.  HENT:     Head: Normocephalic and atraumatic.  Cardiovascular:  Rate and Rhythm: Normal rate.  Pulmonary:     Effort: Pulmonary effort is normal. No respiratory distress.  Abdominal:     General: There is no distension.  Musculoskeletal:        General: Normal range of motion.     Cervical back: Normal range of motion.     Right lower leg: No edema.     Left lower leg: No edema.  Neurological:     Mental Status: He is alert.     ED Results / Procedures / Treatments   Labs (all labs ordered are listed, but only abnormal results are displayed) Labs Reviewed  BASIC METABOLIC PANEL - Abnormal; Notable for the following components:      Result Value   Potassium 3.2 (*)    Glucose, Bld 113 (*)    Creatinine, Ser 1.28 (*)    All other components within normal limits  CBC  TROPONIN I (HIGH SENSITIVITY)  TROPONIN I  (HIGH SENSITIVITY)    EKG EKG Interpretation Date/Time:  Tuesday November 14 2022 20:58:11 EDT Ventricular Rate:  100 PR Interval:  126 QRS Duration:  106 QT Interval:  342 QTC Calculation: 441 R Axis:   32  Text Interpretation: Normal sinus rhythm Incomplete right bundle branch block Borderline ECG When compared with ECG of 19-Jun-2022 19:50, No significant change was found  similar to april 2024 with exception of improved ST changes in septal leads and high lateral leads Confirmed by Marily Memos [phone]) on 11/14/2022 11:07:09 PM  Radiology DG Chest 2 View  Result Date: 11/14/2022 CLINICAL DATA:  Chest pain EXAM: CHEST - 2 VIEW COMPARISON:  CXR 06/19/22. FINDINGS: The heart size and mediastinal contours are within normal limits. No consolidation, pneumothorax or effusion. No edema. The visualized skeletal structures are unremarkable. IMPRESSION: No acute cardiopulmonary disease. Electronically Signed   By: Karen Kays M.D.   On: 11/14/2022 21:26    Procedures Procedures    Medications Ordered in ED Medications - No data to display  ED Course/ Medical Decision Making/ A&P                                 Medical Decision Making Amount and/or Complexity of Data Reviewed Labs: ordered. Radiology: ordered.   Mild hypokalemia and mild elevation in his creatinine.  Chest x-ray is okay.  Troponins are reassuring.  EKG is reassuring.  Low suspicion for ACS or PE at this time.  Seems muscular in nature.  Will rehydrate with electrolyte solution at home and follow-up with PCP in 7 to 10 days for recheck of his labs.  Final Clinical Impression(s) / ED Diagnoses Final diagnoses:  Nonspecific chest pain  Hypokalemia  Elevated serum creatinine    Rx / DC Orders ED Discharge Orders     None         Javonn Gauger, Barbara Cower, MD 11/15/22 [phone]

## 2022-11-18 ENCOUNTER — Emergency Department (HOSPITAL_COMMUNITY): Payer: Medicaid Other

## 2022-11-18 ENCOUNTER — Encounter (HOSPITAL_COMMUNITY): Payer: Self-pay

## 2022-11-18 ENCOUNTER — Inpatient Hospital Stay (HOSPITAL_COMMUNITY)
Admission: EM | Admit: 2022-11-18 | Discharge: 2022-11-21 | DRG: 057 | Disposition: A | Discharge status: Home / Self Care | Payer: Medicaid Other | Attending: Internal Medicine | Admitting: Internal Medicine

## 2022-11-18 ENCOUNTER — Other Ambulatory Visit: Payer: Self-pay

## 2022-11-18 DIAGNOSIS — R0789 Other chest pain: Secondary | ICD-10-CM | POA: Diagnosis present

## 2022-11-18 DIAGNOSIS — F17200 Nicotine dependence, unspecified, uncomplicated: Secondary | ICD-10-CM | POA: Diagnosis present

## 2022-11-18 DIAGNOSIS — F411 Generalized anxiety disorder: Secondary | ICD-10-CM | POA: Diagnosis present

## 2022-11-18 DIAGNOSIS — Z91030 Bee allergy status: Secondary | ICD-10-CM

## 2022-11-18 DIAGNOSIS — T43215A Adverse effect of selective serotonin and norepinephrine reuptake inhibitors, initial encounter: Secondary | ICD-10-CM | POA: Diagnosis present

## 2022-11-18 DIAGNOSIS — G2579 Other drug induced movement disorders: Principal | ICD-10-CM | POA: Diagnosis present

## 2022-11-18 DIAGNOSIS — Y929 Unspecified place or not applicable: Secondary | ICD-10-CM

## 2022-11-18 DIAGNOSIS — Z6835 Body mass index (BMI) 35.0-35.9, adult: Secondary | ICD-10-CM

## 2022-11-18 DIAGNOSIS — F17210 Nicotine dependence, cigarettes, uncomplicated: Secondary | ICD-10-CM | POA: Diagnosis present

## 2022-11-18 DIAGNOSIS — K6389 Other specified diseases of intestine: Secondary | ICD-10-CM | POA: Diagnosis present

## 2022-11-18 DIAGNOSIS — Z885 Allergy status to narcotic agent status: Secondary | ICD-10-CM

## 2022-11-18 DIAGNOSIS — R0682 Tachypnea, not elsewhere classified: Secondary | ICD-10-CM | POA: Diagnosis present

## 2022-11-18 DIAGNOSIS — E669 Obesity, unspecified: Secondary | ICD-10-CM | POA: Diagnosis present

## 2022-11-18 DIAGNOSIS — Z8249 Family history of ischemic heart disease and other diseases of the circulatory system: Secondary | ICD-10-CM

## 2022-11-18 DIAGNOSIS — Z79899 Other long term (current) drug therapy: Secondary | ICD-10-CM

## 2022-11-18 DIAGNOSIS — Z888 Allergy status to other drugs, medicaments and biological substances status: Secondary | ICD-10-CM

## 2022-11-18 DIAGNOSIS — R509 Fever, unspecified: Secondary | ICD-10-CM | POA: Diagnosis present

## 2022-11-18 DIAGNOSIS — R61 Generalized hyperhidrosis: Secondary | ICD-10-CM | POA: Diagnosis present

## 2022-11-18 DIAGNOSIS — I10 Essential (primary) hypertension: Secondary | ICD-10-CM | POA: Diagnosis present

## 2022-11-18 DIAGNOSIS — R000 Tachycardia, unspecified: Secondary | ICD-10-CM | POA: Diagnosis present

## 2022-11-18 DIAGNOSIS — G252 Other specified forms of tremor: Secondary | ICD-10-CM | POA: Diagnosis present

## 2022-11-18 DIAGNOSIS — Z981 Arthrodesis status: Secondary | ICD-10-CM

## 2022-11-18 DIAGNOSIS — E876 Hypokalemia: Secondary | ICD-10-CM | POA: Diagnosis present

## 2022-11-18 DIAGNOSIS — K219 Gastro-esophageal reflux disease without esophagitis: Secondary | ICD-10-CM | POA: Diagnosis present

## 2022-11-18 DIAGNOSIS — Z91041 Radiographic dye allergy status: Secondary | ICD-10-CM

## 2022-11-18 LAB — CBC WITH DIFFERENTIAL/PLATELET
Abs Immature Granulocytes: 0.04 10*3/uL (ref 0.00–0.07)
Basophils Absolute: 0.1 10*3/uL (ref 0.0–0.1)
Basophils Relative: 1 %
Eosinophils Absolute: 0.5 10*3/uL (ref 0.0–0.5)
Eosinophils Relative: 6 %
HCT: 47.5 % (ref 39.0–52.0)
Hemoglobin: 15.8 g/dL (ref 13.0–17.0)
Immature Granulocytes: 1 %
Lymphocytes Relative: 25 %
Lymphs Abs: 2.1 10*3/uL (ref 0.7–4.0)
MCH: 27.2 pg (ref 26.0–34.0)
MCHC: 33.3 g/dL (ref 30.0–36.0)
MCV: 81.8 fL (ref 80.0–100.0)
Monocytes Absolute: 0.9 10*3/uL (ref 0.1–1.0)
Monocytes Relative: 11 %
Neutro Abs: 4.9 10*3/uL (ref 1.7–7.7)
Neutrophils Relative %: 56 %
Platelets: 228 10*3/uL (ref 150–400)
RBC: 5.81 MIL/uL (ref 4.22–5.81)
RDW: 13.3 % (ref 11.5–15.5)
WBC: 8.5 10*3/uL (ref 4.0–10.5)
nRBC: 0 % (ref 0.0–0.2)

## 2022-11-18 LAB — COMPREHENSIVE METABOLIC PANEL
ALT: 42 U/L (ref 0–44)
AST: 24 U/L (ref 15–41)
Albumin: 4.1 g/dL (ref 3.5–5.0)
Alkaline Phosphatase: 57 U/L (ref 38–126)
Anion gap: 9 (ref 5–15)
BUN: 13 mg/dL (ref 6–20)
CO2: 25 mmol/L (ref 22–32)
Calcium: 8.7 mg/dL — ABNORMAL LOW (ref 8.9–10.3)
Chloride: 102 mmol/L (ref 98–111)
Creatinine, Ser: 1.08 mg/dL (ref 0.61–1.24)
GFR, Estimated: >60 mL/min (ref >60)
Glucose, Bld: 103 mg/dL — ABNORMAL HIGH (ref 70–99)
Potassium: 3.3 mmol/L — ABNORMAL LOW (ref 3.5–5.1)
Sodium: 136 mmol/L (ref 135–145)
Total Bilirubin: 0.7 mg/dL (ref 0.3–1.2)
Total Protein: 7.1 g/dL (ref 6.5–8.1)

## 2022-11-18 LAB — TROPONIN I (HIGH SENSITIVITY): Troponin I (High Sensitivity): 3 ng/L (ref <18)

## 2022-11-18 LAB — CK: Total CK: 90 U/L (ref 49–397)

## 2022-11-18 MED ORDER — SODIUM CHLORIDE 0.9 % IV BOLUS
500.0000 mL | Freq: Once | INTRAVENOUS | Status: AC
  Administered 2022-11-18: 500 mL via INTRAVENOUS

## 2022-11-18 MED ORDER — SODIUM CHLORIDE 0.9 % IV SOLN: INTRAVENOUS | Status: DC

## 2022-11-18 MED ORDER — DIAZEPAM 5 MG/ML IJ SOLN
5.0000 mg | Freq: Once | INTRAMUSCULAR | Status: AC
  Administered 2022-11-18: 5 mg via INTRAVENOUS
  Filled 2022-11-18: qty 2

## 2022-11-18 NOTE — ED Triage Notes (Signed)
MD gave new anxiety medication, Venlafaxine, started yesterday morning. Took 2 doses, one in the am one at night.   Since starting pt has been sweating and feeling agitated.

## 2022-11-18 NOTE — ED Provider Notes (Signed)
Care assumed from Dr. Deretha Emory, patient with probable serotonin syndrome, chest pain. He is getting benzodiazepines, also serial troponins.  Patient started having increased muscle spasm, repeat dose of diazepam is prescribed.  I also note that he is hypokalemic and I have ordered a dose of oral potassium.  I have observed the patient overnight.  He is generally tending towards improvement.  Is intermittently having spasms and I do not feel it is safe for him to be discharged at this point.  I discussed the case with Dr. Arville Care of [facility] hospitalist, who agrees to admit the patient.   Dione Booze, MD 11/19/22 [phone]

## 2022-11-18 NOTE — ED Provider Notes (Addendum)
EMERGENCY DEPARTMENT AT [facility] Provider Note   CSN: [id number] Arrival date & time: 11/18/22  1938     History  Chief Complaint  Patient presents with   Medication Reaction    Anthony Sharp is a 32 y.o. male.  Patient here with a complaint of some diaphoresis some anxiety some tremors decreased appetite not able to sleep well.  Patient just recently started Effexor took first dose yesterday morning at 0 737.5 mg and then took another dose at 1930 last night of 37.5 mg and about an hour after that started develop the symptoms.  Patient is never been on this medicine before.  Patient continues to feel agitated.  Temp here was 99.1 heart rate 103.  Respirations 18 blood pressure 153/104 oxygen sats 100% on room air.  He was prescribed this medication for anxiety.  Symptoms associated with some chest pain left anterior.  No real shortness of breath.  Past medical history for hypertension and anxiety, hereditary hemochromatosis, everyday smoker.       Home Medications Prior to Admission medications   Medication Sig Start Date End Date Taking? Authorizing Provider  amLODipine (NORVASC) 10 MG tablet Take 1 tablet (10 mg total) by mouth daily. 08/23/21   McGowen, Maryjean Morn, MD  ciprofloxacin (CIPRO) 500 MG tablet Take 1 tablet (500 mg total) by mouth every 12 (twelve) hours. 07/25/22   Gerhard Munch, MD  diphenhydrAMINE (BENADRYL) 50 MG tablet Take 1 tablet (50 mg total) by mouth once for 1 dose. Pt to take 50 mg of benadryl on 09/05/22 at 7:00 AM. Please call [phone] with any questions. 08/16/22 08/16/22  Paulina Fusi, MD  hydrOXYzine (ATARAX) 25 MG tablet Take 1 tablet (25 mg total) by mouth every 8 (eight) hours as needed. 06/25/22   White, Elita Boone, NP  ibuprofen (ADVIL,MOTRIN) 200 MG tablet Take 200 mg by mouth every 6 (six) hours as needed for headache, mild pain or moderate pain.    [provider]  irbesartan (AVAPRO) 75 MG tablet 1 tab po qd  08/23/21   McGowen, Maryjean Morn, MD  LORazepam (ATIVAN) 1 MG tablet Take 1 tablet (1 mg total) by mouth 3 (three) times daily as needed for anxiety. 06/19/22   Smitty Knudsen, PA-C  ondansetron (ZOFRAN-ODT) 4 MG disintegrating tablet Take 1 tablet (4 mg total) by mouth every 8 (eight) hours as needed for nausea or vomiting. 07/25/22   Gerhard Munch, MD  predniSONE (DELTASONE) 50 MG tablet Pt to take 50 mg of prednisone on 09/04/22 at 7:00 PM, 50 mg of prednisone on 09/05/22 at 1:00 AM, and 50 mg of prednisone on 09/05/22 at 7:00 AM. Pt is also to take 50 mg of benadryl on 09/05/22 at 7:00 AM. Please call [phone] with any questions. 08/16/22   Paulina Fusi, MD      Allergies    Iohexol, Bee venom, Hydrocodone, and Chlorthalidone    Review of Systems   Review of Systems  Constitutional:  Positive for appetite change. Negative for chills and fever.  HENT:  Negative for ear pain and sore throat.   Eyes:  Negative for pain and visual disturbance.  Respiratory:  Negative for cough and shortness of breath.   Cardiovascular:  Positive for chest pain. Negative for palpitations.  Gastrointestinal:  Negative for abdominal pain and vomiting.  Genitourinary:  Negative for dysuria and hematuria.  Musculoskeletal:  Negative for arthralgias and back pain.  Skin:  Negative for color change and rash.  Neurological:  Positive for tremors. Negative for seizures and syncope.  Psychiatric/Behavioral:  Positive for agitation.   All other systems reviewed and are negative.   Physical Exam Updated Vital Signs BP (!) 138/100   Pulse 95   Temp 99.1 F (37.3 C) (Oral)   Resp 18   Ht 1.829 m (6')   Wt 117.9 kg   SpO2 92%   BMI 35.26 kg/m  Physical Exam Vitals and nursing note reviewed.  Constitutional:      General: He is not in acute distress.    Appearance: He is well-developed.  HENT:     Head: Normocephalic and atraumatic.  Eyes:     Extraocular Movements: Extraocular movements intact.      Conjunctiva/sclera: Conjunctivae normal.     Pupils: Pupils are equal, round, and reactive to light.     Comments: Sclera erythema.  No hyphema.  There is ocular clonus.   Cardiovascular:     Rate and Rhythm: Regular rhythm. Tachycardia present.     Heart sounds: No murmur heard. Pulmonary:     Effort: Pulmonary effort is normal. No respiratory distress.     Breath sounds: Normal breath sounds. No wheezing or rales.  Abdominal:     Palpations: Abdomen is soft.     Tenderness: There is no abdominal tenderness.  Musculoskeletal:        General: No swelling.     Cervical back: Neck supple. No rigidity.  Skin:    General: Skin is warm and dry.     Capillary Refill: Capillary refill takes less than 2 seconds.  Neurological:     Mental Status: He is alert.     Cranial Nerves: No cranial nerve deficit.     Sensory: No sensory deficit.     Motor: No weakness.     Comments: Positive clonus to hands and feet.  Not able to appreciate any hyperreflexia.  There are tremors.  Psychiatric:        Mood and Affect: Mood normal.     ED Results / Procedures / Treatments   Labs (all labs ordered are listed, but only abnormal results are displayed) Labs Reviewed  CBC WITH DIFFERENTIAL/PLATELET  COMPREHENSIVE METABOLIC PANEL  CK  TROPONIN I (HIGH SENSITIVITY)    EKG None  Radiology No results found.  Procedures Procedures    Medications Ordered in ED Medications  0.9 %  sodium chloride infusion (has no administration in time range)  sodium chloride 0.9 % bolus 500 mL (has no administration in time range)  diazepam (VALIUM) injection 5 mg (has no administration in time range)    ED Course/ Medical Decision Making/ A&P                                 Medical Decision Making Amount and/or Complexity of Data Reviewed Labs: ordered. Radiology: ordered.  Risk Prescription drug management.   [place] highly suspicious for serotonin syndrome.  Will get chest x-ray will give IV  fluids will give IV Valium.  Will check troponins.  Will get liver function test.  Will get CK.  Cardiac monitoring.  Chest x-ray.  Patient currently not febrile not hypotensive.  Showing some evidence of some mild hypertension and tachycardia.  Will continue to monitor blood pressures to make sure they are not labile.  Once labs are back.  Reassessment of patient may require admission or may need to be monitored at least overnight to see if he  improves with interventions.  Right now just ordered the Valium.  Patient's current EKG heart rate of 76.  Incomplete right bundle branch block.  Will obviously have patient hold the Effexor.   Final Clinical Impression(s) / ED Diagnoses Final diagnoses:  Atypical chest pain  Serotonin syndrome    Rx / DC Orders ED Discharge Orders     None         Vanetta Mulders, MD 11/18/22 2321    Vanetta Mulders, MD 11/18/22 2322    Vanetta Mulders, MD 11/18/22 2322

## 2022-11-19 DIAGNOSIS — F411 Generalized anxiety disorder: Secondary | ICD-10-CM

## 2022-11-19 DIAGNOSIS — I10 Essential (primary) hypertension: Secondary | ICD-10-CM

## 2022-11-19 DIAGNOSIS — E876 Hypokalemia: Secondary | ICD-10-CM | POA: Diagnosis not present

## 2022-11-19 DIAGNOSIS — Z6835 Body mass index (BMI) 35.0-35.9, adult: Secondary | ICD-10-CM

## 2022-11-19 DIAGNOSIS — G2579 Other drug induced movement disorders: Secondary | ICD-10-CM | POA: Diagnosis present

## 2022-11-19 DIAGNOSIS — F17200 Nicotine dependence, unspecified, uncomplicated: Secondary | ICD-10-CM

## 2022-11-19 LAB — MAGNESIUM: Magnesium: 2.2 mg/dL (ref 1.7–2.4)

## 2022-11-19 LAB — TROPONIN I (HIGH SENSITIVITY): Troponin I (High Sensitivity): 3 ng/L (ref <18)

## 2022-11-19 LAB — TSH: TSH: 2.93 u[IU]/mL (ref 0.350–4.500)

## 2022-11-19 MED ORDER — POTASSIUM CHLORIDE CRYS ER 20 MEQ PO TBCR
40.0000 meq | EXTENDED_RELEASE_TABLET | Freq: Once | ORAL | Status: AC
  Administered 2022-11-19: 40 meq via ORAL
  Filled 2022-11-19: qty 2

## 2022-11-19 MED ORDER — LORAZEPAM 1 MG PO TABS
1.0000 mg | ORAL_TABLET | Freq: Three times a day (TID) | ORAL | Status: DC | PRN
  Administered 2022-11-19 – 2022-11-20 (×4): 1 mg via ORAL
  Filled 2022-11-19 (×4): qty 1

## 2022-11-19 MED ORDER — HYDROXYZINE HCL 25 MG PO TABS: 25.0000 mg | ORAL_TABLET | Freq: Three times a day (TID) | ORAL | Status: DC | PRN

## 2022-11-19 MED ORDER — ACETAMINOPHEN 650 MG RE SUPP: 650.0000 mg | Freq: Four times a day (QID) | RECTAL | Status: DC | PRN

## 2022-11-19 MED ORDER — ACETAMINOPHEN 325 MG PO TABS: 650.0000 mg | ORAL_TABLET | Freq: Four times a day (QID) | ORAL | Status: DC | PRN

## 2022-11-19 MED ORDER — ONDANSETRON HCL 4 MG/2ML IJ SOLN: 4.0000 mg | Freq: Four times a day (QID) | INTRAMUSCULAR | Status: DC | PRN

## 2022-11-19 MED ORDER — DIAZEPAM 5 MG/ML IJ SOLN
5.0000 mg | Freq: Once | INTRAMUSCULAR | Status: AC
  Administered 2022-11-19: 5 mg via INTRAVENOUS
  Filled 2022-11-19: qty 2

## 2022-11-19 MED ORDER — METHOCARBAMOL 1000 MG/10ML IJ SOLN
500.0000 mg | Freq: Three times a day (TID) | INTRAVENOUS | Status: DC | PRN
  Filled 2022-11-19: qty 5

## 2022-11-19 MED ORDER — AMLODIPINE BESYLATE 5 MG PO TABS
10.0000 mg | ORAL_TABLET | Freq: Every day | ORAL | Status: DC
  Administered 2022-11-19 – 2022-11-21 (×3): 10 mg via ORAL
  Filled 2022-11-19 (×3): qty 2

## 2022-11-19 MED ORDER — ORAL CARE MOUTH RINSE: 15.0000 mL | OROMUCOSAL | Status: DC | PRN

## 2022-11-19 MED ORDER — ENOXAPARIN SODIUM 40 MG/0.4ML IJ SOSY: 40.0000 mg | PREFILLED_SYRINGE | INTRAMUSCULAR | Status: DC

## 2022-11-19 MED ORDER — ONDANSETRON HCL 4 MG PO TABS: 4.0000 mg | ORAL_TABLET | Freq: Four times a day (QID) | ORAL | Status: DC | PRN

## 2022-11-19 MED ORDER — IRBESARTAN 75 MG PO TABS: 75.0000 mg | ORAL_TABLET | Freq: Every day | ORAL | Status: DC

## 2022-11-19 NOTE — H&P (Signed)
History and Physical    Patient: Anthony Sharp MWN:[id number] DOB: 06/28/1990 DOA: 11/18/2022 DOS: the patient was seen and examined on 11/19/2022 PCP: [facility]  Patient coming from: Home  Chief Complaint:  Chief Complaint  Patient presents with   Medication Reaction   HPI: Anthony Sharp is a 32 y.o. male with medical history significant of class II obesity, hypertension, anxiety, tobacco abuse and history of hereditary hemochromatosis; who presented to the hospital secondary to medication reaction.  Patient reports experiencing low-grade temperature, agitation, tachypnea, tachycardia, diaphoresis and tremors after taking 2 dosages of prescribed Effexor as part of treatment for his underlying anxiety condition. Patient expressed some chest tightness, but no frank pain.  There has not been any sick contacts, associated nausea or vomiting, dysuria, hematuria, focal weaknesses, hematochezia or melena.  In the ED patient received Valium, fluid resuscitation and supportive care.  Blood work demonstrating mild hypokalemia and a stable CK levels.  Chest x-ray with no acute cardiopulmonary process.  Patient was still slightly tachycardic, agitated and not feeling well; TRH contacted to place patient in the hospital for further evaluation and management of mild to moderate serotonin syndrome.  Review of Systems: As mentioned in the history of present illness. All other systems reviewed and are negative. Past Medical History:  Diagnosis Date   Anxiety    Erythrocytosis    elev transferrin sat   Goals of care, counseling/discussion 04/25/2021   Hemochromatosis, hereditary (HCC) 04/25/2021   Hypertension    Obesity, Class II, BMI 35-39.9    Right buttock pain    Mild/mod L spine deg changes.  He is s/p ortho eval->L4-5 and L5-S1 ESI no help.  Plan is for diagnostic SI jt inj   Past Surgical History:  Procedure Laterality Date   BACK SURGERY  2018   L5-S1 microdiscectomy    Social History:  reports that he has been smoking cigarettes. His smokeless tobacco use includes snuff. He reports current alcohol use. He reports that he does not use drugs.  Allergies  Allergen Reactions   Iohexol Hives and Itching    Broke out in hives and itching after contrast injection on 07/25/2022   Bee Venom Swelling   Hydrocodone Itching   Chlorthalidone Other (See Comments)    Tinnitus and insomnia    Family History  Problem Relation Age of Onset   Hypertension Father    Hypertension Brother     Prior to Admission medications   Medication Sig Start Date End Date Taking? Authorizing Provider  amLODipine (NORVASC) 10 MG tablet Take 1 tablet (10 mg total) by mouth daily. 08/23/21   McGowen, Maryjean Morn, MD  diphenhydrAMINE (BENADRYL) 50 MG tablet Take 1 tablet (50 mg total) by mouth once for 1 dose. Pt to take 50 mg of benadryl on 09/05/22 at 7:00 AM. Please call [phone] with any questions. 08/16/22 08/16/22  Paulina Fusi, MD  hydrOXYzine (ATARAX) 25 MG tablet Take 1 tablet (25 mg total) by mouth every 8 (eight) hours as needed. 06/25/22   White, Elita Boone, NP  ibuprofen (ADVIL,MOTRIN) 200 MG tablet Take 200 mg by mouth every 6 (six) hours as needed for headache, mild pain or moderate pain.    [provider]  irbesartan (AVAPRO) 75 MG tablet 1 tab po qd 08/23/21   McGowen, Maryjean Morn, MD  LORazepam (ATIVAN) 1 MG tablet Take 1 tablet (1 mg total) by mouth 3 (three) times daily as needed for anxiety. 06/19/22   Smitty Knudsen, PA-C  ondansetron (ZOFRAN-ODT) 4 MG disintegrating tablet Take 1 tablet (4 mg total) by mouth every 8 (eight) hours as needed for nausea or vomiting. 07/25/22   Gerhard Munch, MD    Physical Exam: Vitals:   11/19/22 0530 11/19/22 0600 11/19/22 0630 11/19/22 0844  BP: (!) 133/98 128/85 130/84 (!) 140/100  Pulse: 67 74 71 67  Resp: 16 15 14 16   Temp:    98.7 F (37.1 C)  TempSrc:    Oral  SpO2: 96% 96% 93% 97%  Weight:    117.5 kg  Height:    6'  (1.829 m)   General exam: Alert, awake, oriented x 3; feeling slightly agitated.  Vital symptoms improving. Respiratory system: Clear to auscultation. Respiratory effort normal.  Good saturation on room air. Cardiovascular system:RRR. No murmurs, rubs, gallops. Gastrointestinal system: Abdomen is obese, nondistended, soft and nontender. No organomegaly or masses felt. Normal bowel sounds heard. Central nervous system: Alert and oriented. No focal neurological deficits. Extremities: No C/C/E, +pedal pulses Skin: No petechiae. Psychiatry: Mood & affect appropriate.   Data Reviewed: Troponin: 3 >> 3 EKG: No acute ischemic changes; sinus rhythm. CK: 90 Comprehensive metabolic panel: Sodium 136, potassium 3.3, chloride 102, bicarb 25, BUN 13, creatinine 1.08 and normal LFTs. CBC: WBCs 8.5, hemoglobin 15.8 and platelet count 228K   Assessment and Plan: * Serotonin syndrome -Patient recently started on Effexor; 37.5 mg twice a day; Mild to moderate serotonin syndrome developed after first date of usage. -Reports tremors, low-grade temperature, agitation, tachycardia, tachypnea and diaphoresis -Symptoms has already started to improve after receiving Valium and fluid resuscitation -Still slightly agitated and is present muscle spasms. -CK level without significant elevation -Continue adequate hydration; offending agent has been discontinued -As needed benzodiazepine and Robaxin will be provided -Follow clinical response.  Hypokalemia -Replete electrolytes and follow trend -Will check magnesium level.  GAD (generalized anxiety disorder) -Continue as needed benzodiazepines -Outpatient follow-up with PCP and if needed psychiatry service for further management. -At time of evaluation patient mood was stable.  Obesity -Body mass index is 35.13 kg/m. -Low-calorie diet, portion control and increase physical activity discussed with patient.  Smoker -Smoking cessation counseling  provided -Decline nicotine patch at the moment.  Hypertension -Continue the use of Norvasc. -Heart healthy diet and tobacco cessation discussed with patient -Follow vital signs.      Advance Care Planning:   Code Status: Full Code   Consults: None  Family Communication: Girlfriend at bedside.  Severity of Illness: The appropriate patient status for this patient is OBSERVATION. Observation status is judged to be reasonable and necessary in order to provide the required intensity of service to ensure the patient's safety. The patient's presenting symptoms, physical exam findings, and initial radiographic and laboratory data in the context of their medical condition is felt to place them at decreased risk for further clinical deterioration. Furthermore, it is anticipated that the patient will be medically stable for discharge from the hospital within 2 midnights of admission.   Author: Vassie Loll, MD 11/19/2022 10:50 AM  For on call review [email or web address].

## 2022-11-19 NOTE — Assessment & Plan Note (Signed)
-  Body mass index is 35.13 kg/m. -Low-calorie diet, portion control and increase physical activity discussed with patient.

## 2022-11-19 NOTE — Assessment & Plan Note (Signed)
-  Continue as needed benzodiazepines -Outpatient follow-up with PCP and if needed psychiatry service for further management. -At time of evaluation patient mood was stable.

## 2022-11-19 NOTE — Assessment & Plan Note (Signed)
-  Continue the use of Norvasc. -Heart healthy diet and tobacco cessation discussed with patient -Follow vital signs.

## 2022-11-19 NOTE — ED Notes (Signed)
ED TO INPATIENT HANDOFF REPORT  ED Nurse Name and Phone #: Wandra Mannan, Paramedic [phone]  S Name/Age/Gender Anthony Sharp 32 y.o. male Room/Bed: APA14/APA14  Code Status   Code Status: Not on file  Home/SNF/Other Home Patient oriented to: self, place, time, and situation Is this baseline? Yes   Triage Complete: Triage complete  Chief Complaint Serotonin syndrome [G25.79]  Triage Note MD gave new anxiety medication, Venlafaxine, started yesterday morning. Took 2 doses, one in the am one at night.   Since starting pt has been sweating and feeling agitated.   Allergies Allergies  Allergen Reactions   Iohexol Hives and Itching    Broke out in hives and itching after contrast injection on 07/25/2022   Bee Venom Swelling   Hydrocodone Itching   Chlorthalidone Other (See Comments)    Tinnitus and insomnia    Level of Care/Admitting Diagnosis ED Disposition     ED Disposition  Admit   Condition  --   Comment  Hospital [facility]: [facility] [[id number]]  Level of Care: Telemetry [5]  Covid Evaluation: Asymptomatic - no recent exposure (last 10 days) testing not required  Diagnosis: Serotonin syndrome [290585]  Admitting Physician: Hannah Beat [[id number]]  Attending Physician: Hannah Beat [[id number]]  Certification:: I certify this patient will need inpatient services for at least 2 midnights  Expected Medical Readiness: 11/21/2022          B Medical/Surgery History Past Medical History:  Diagnosis Date   Anxiety    Erythrocytosis    elev transferrin sat   Goals of care, counseling/discussion 04/25/2021   Hemochromatosis, hereditary (HCC) 04/25/2021   Hypertension    Obesity, Class II, BMI 35-39.9    Right buttock pain    Mild/mod L spine deg changes.  He is s/p ortho eval->L4-5 and L5-S1 ESI no help.  Plan is for diagnostic SI jt inj   Past Surgical History:  Procedure Laterality Date   BACK SURGERY  2018   L5-S1 microdiscectomy     A IV  Location/Drains/Wounds Patient Lines/Drains/Airways Status     Active Line/Drains/Airways     Name Placement date Placement time Site Days   Peripheral IV 11/18/22 20 G Left Antecubital 11/18/22  2316  Antecubital  1            Intake/Output Last 24 hours  Intake/Output Summary (Last 24 hours) at 11/19/2022 0719 Last data filed at 11/19/2022 0454 Gross per 24 hour  Intake 500 ml  Output 1200 ml  Net -700 ml    Labs/Imaging Results for orders placed or performed during the hospital encounter of 11/18/22 (from the past 48 hour(s))  CBC with Differential/Platelet     Status: None   Collection Time: 11/18/22 11:15 PM  Result Value Ref Range   WBC 8.5 4.0 - 10.5 K/uL   RBC 5.81 4.22 - 5.81 MIL/uL   Hemoglobin 15.8 13.0 - 17.0 g/dL   HCT 69.6 29.5 - 28.4 %   MCV 81.8 80.0 - 100.0 fL   MCH 27.2 26.0 - 34.0 pg   MCHC 33.3 30.0 - 36.0 g/dL   RDW 13.2 44.0 - 10.2 %   Platelets 228 150 - 400 K/uL   nRBC 0.0 0.0 - 0.2 %   Neutrophils Relative % 56 %   Neutro Abs 4.9 1.7 - 7.7 K/uL   Lymphocytes Relative 25 %   Lymphs Abs 2.1 0.7 - 4.0 K/uL   Monocytes Relative 11 %   Monocytes Absolute 0.9 0.1 -  1.0 K/uL   Eosinophils Relative 6 %   Eosinophils Absolute 0.5 0.0 - 0.5 K/uL   Basophils Relative 1 %   Basophils Absolute 0.1 0.0 - 0.1 K/uL   Immature Granulocytes 1 %   Abs Immature Granulocytes 0.04 0.00 - 0.07 K/uL    Comment: Performed at [facility], [address] [place], [place] [address]  Comprehensive metabolic panel     Status: Abnormal   Collection Time: 11/18/22 11:15 PM  Result Value Ref Range   Sodium 136 135 - 145 mmol/L   Potassium 3.3 (L) 3.5 - 5.1 mmol/L   Chloride 102 98 - 111 mmol/L   CO2 25 22 - 32 mmol/L   Glucose, Bld 103 (H) 70 - 99 mg/dL    Comment: Glucose reference range applies only to samples taken after fasting for at least 8 hours.   BUN 13 6 - 20 mg/dL   Creatinine, Ser 6.04 0.61 - 1.24 mg/dL   Calcium 8.7 (L) 8.9 - 10.3 mg/dL   Total  Protein 7.1 6.5 - 8.1 g/dL   Albumin 4.1 3.5 - 5.0 g/dL   AST 24 15 - 41 U/L   ALT 42 0 - 44 U/L   Alkaline Phosphatase 57 38 - 126 U/L   Total Bilirubin 0.7 0.3 - 1.2 mg/dL   GFR, Estimated >54 >09 mL/min    Comment: (NOTE) Calculated using the CKD-EPI Creatinine Equation (2021)    Anion gap 9 5 - 15    Comment: Performed at [facility], [address], [place], [place] [address]  Troponin I (High Sensitivity)     Status: None   Collection Time: 11/18/22 11:15 PM  Result Value Ref Range   Troponin I (High Sensitivity) 3 <18 ng/L    Comment: (NOTE) Elevated high sensitivity troponin I (hsTnI) values and significant  changes across serial measurements may suggest ACS but many other  chronic and acute conditions are known to elevate hsTnI results.  Refer to the "Links" section for chest pain algorithms and additional  guidance. Performed at [facility], [address], [place], [place] [address]   CK     Status: None   Collection Time: 11/18/22 11:15 PM  Result Value Ref Range   Total CK 90 49 - 397 U/L    Comment: Performed at [facility], [address], [place], [place] [address]  Troponin I (High Sensitivity)     Status: None   Collection Time: 11/19/22  1:33 AM  Result Value Ref Range   Troponin I (High Sensitivity) 3 <18 ng/L    Comment: (NOTE) Elevated high sensitivity troponin I (hsTnI) values and significant  changes across serial measurements may suggest ACS but many other  chronic and acute conditions are known to elevate hsTnI results.  Refer to the "Links" section for chest pain algorithms and additional  guidance. Performed at [facility], [address] [place], [place] [address]    DG Chest Port 1 View  Result Date: 11/19/2022 CLINICAL DATA:  Chest pain EXAM: PORTABLE CHEST 1 VIEW COMPARISON:  11/14/2022 FINDINGS: The heart size and mediastinal contours are within normal limits. Both lungs are clear. The visualized skeletal structures are unremarkable.  IMPRESSION: Normal study. Electronically Signed   By: Charlett Nose M.D.   On: 11/19/2022 00:06    Pending Labs Unresulted Labs (From admission, onward)    None       Vitals/Pain Today's Vitals   11/19/22 0500 11/19/22 0530 11/19/22 0600 11/19/22 0630  BP: 122/81 Marland Kitchen)  133/98 128/85 130/84  Pulse: 67 67 74 71  Resp: 14 16 15 14   Temp:      TempSrc:      SpO2: 95% 96% 96% 93%  Weight:      Height:      PainSc:        Isolation Precautions No active isolations  Medications Medications  0.9 %  sodium chloride infusion ( Intravenous New Bag/Given 11/18/22 2358)  sodium chloride 0.9 % bolus 500 mL (0 mLs Intravenous Stopped 11/18/22 2357)  diazepam (VALIUM) injection 5 mg (5 mg Intravenous Given 11/18/22 2322)  diazepam (VALIUM) injection 5 mg (5 mg Intravenous Given 11/19/22 0043)  potassium chloride SA (KLOR-CON M) CR tablet 40 mEq (40 mEq Oral Given 11/19/22 0104)    Mobility walks     Focused Assessments Cardiac Assessment Handoff:    Lab Results  Component Value Date   CKTOTAL 90 11/18/2022   No results found for: "DDIMER" Does the Patient currently have chest pain? No    R Recommendations: See Admitting Provider Note  Report given to:   Additional Notes: N/A

## 2022-11-19 NOTE — Assessment & Plan Note (Signed)
-  Smoking cessation counseling provided -Decline nicotine patch at the moment.

## 2022-11-19 NOTE — Assessment & Plan Note (Signed)
-  Patient recently started on Effexor; 37.5 mg twice a day; Mild to moderate serotonin syndrome developed after first date of usage. -Reports tremors, low-grade temperature, agitation, tachycardia, tachypnea and diaphoresis -Symptoms has already started to improve after receiving Valium and fluid resuscitation -Still slightly agitated and is present muscle spasms. -CK level without significant elevation -Continue adequate hydration; offending agent has been discontinued -As needed benzodiazepine and Robaxin will be provided -Follow clinical response.

## 2022-11-19 NOTE — Assessment & Plan Note (Signed)
-  Repleted and within normal limits currently -Magnesium within normal limits -Repeat basic metabolic panel to follow electrolytes trend/stability. -Patient advised to maintain adequate hydration.

## 2022-11-20 ENCOUNTER — Observation Stay (HOSPITAL_COMMUNITY): Payer: Medicaid Other

## 2022-11-20 ENCOUNTER — Inpatient Hospital Stay (HOSPITAL_COMMUNITY): Payer: Medicaid Other

## 2022-11-20 DIAGNOSIS — Z79899 Other long term (current) drug therapy: Secondary | ICD-10-CM | POA: Diagnosis not present

## 2022-11-20 DIAGNOSIS — E876 Hypokalemia: Secondary | ICD-10-CM | POA: Diagnosis present

## 2022-11-20 DIAGNOSIS — R0789 Other chest pain: Secondary | ICD-10-CM | POA: Diagnosis present

## 2022-11-20 DIAGNOSIS — R0682 Tachypnea, not elsewhere classified: Secondary | ICD-10-CM | POA: Diagnosis present

## 2022-11-20 DIAGNOSIS — R1032 Left lower quadrant pain: Secondary | ICD-10-CM | POA: Diagnosis not present

## 2022-11-20 DIAGNOSIS — T43215A Adverse effect of selective serotonin and norepinephrine reuptake inhibitors, initial encounter: Secondary | ICD-10-CM | POA: Diagnosis present

## 2022-11-20 DIAGNOSIS — G2579 Other drug induced movement disorders: Secondary | ICD-10-CM | POA: Diagnosis present

## 2022-11-20 DIAGNOSIS — F17200 Nicotine dependence, unspecified, uncomplicated: Secondary | ICD-10-CM | POA: Diagnosis not present

## 2022-11-20 DIAGNOSIS — K219 Gastro-esophageal reflux disease without esophagitis: Secondary | ICD-10-CM | POA: Diagnosis present

## 2022-11-20 DIAGNOSIS — Z981 Arthrodesis status: Secondary | ICD-10-CM | POA: Diagnosis not present

## 2022-11-20 DIAGNOSIS — F411 Generalized anxiety disorder: Secondary | ICD-10-CM | POA: Diagnosis present

## 2022-11-20 DIAGNOSIS — F17210 Nicotine dependence, cigarettes, uncomplicated: Secondary | ICD-10-CM | POA: Diagnosis present

## 2022-11-20 DIAGNOSIS — R61 Generalized hyperhidrosis: Secondary | ICD-10-CM | POA: Diagnosis present

## 2022-11-20 DIAGNOSIS — Z888 Allergy status to other drugs, medicaments and biological substances status: Secondary | ICD-10-CM | POA: Diagnosis not present

## 2022-11-20 DIAGNOSIS — R509 Fever, unspecified: Secondary | ICD-10-CM | POA: Diagnosis present

## 2022-11-20 DIAGNOSIS — Z91041 Radiographic dye allergy status: Secondary | ICD-10-CM | POA: Diagnosis not present

## 2022-11-20 DIAGNOSIS — Y929 Unspecified place or not applicable: Secondary | ICD-10-CM | POA: Diagnosis not present

## 2022-11-20 DIAGNOSIS — K6389 Other specified diseases of intestine: Secondary | ICD-10-CM | POA: Diagnosis present

## 2022-11-20 DIAGNOSIS — Z885 Allergy status to narcotic agent status: Secondary | ICD-10-CM | POA: Diagnosis not present

## 2022-11-20 DIAGNOSIS — I10 Essential (primary) hypertension: Secondary | ICD-10-CM | POA: Diagnosis present

## 2022-11-20 DIAGNOSIS — R000 Tachycardia, unspecified: Secondary | ICD-10-CM | POA: Diagnosis present

## 2022-11-20 DIAGNOSIS — G252 Other specified forms of tremor: Secondary | ICD-10-CM | POA: Diagnosis present

## 2022-11-20 DIAGNOSIS — Z91030 Bee allergy status: Secondary | ICD-10-CM | POA: Diagnosis not present

## 2022-11-20 DIAGNOSIS — E669 Obesity, unspecified: Secondary | ICD-10-CM | POA: Diagnosis present

## 2022-11-20 DIAGNOSIS — Z8249 Family history of ischemic heart disease and other diseases of the circulatory system: Secondary | ICD-10-CM | POA: Diagnosis not present

## 2022-11-20 DIAGNOSIS — Z6835 Body mass index (BMI) 35.0-35.9, adult: Secondary | ICD-10-CM | POA: Diagnosis not present

## 2022-11-20 LAB — BASIC METABOLIC PANEL
Anion gap: 6 (ref 5–15)
BUN: 13 mg/dL (ref 6–20)
CO2: 23 mmol/L (ref 22–32)
Calcium: 8.5 mg/dL — ABNORMAL LOW (ref 8.9–10.3)
Chloride: 107 mmol/L (ref 98–111)
Creatinine, Ser: 1 mg/dL (ref 0.61–1.24)
GFR, Estimated: >60 mL/min (ref >60)
Glucose, Bld: 95 mg/dL (ref 70–99)
Potassium: 3.7 mmol/L (ref 3.5–5.1)
Sodium: 136 mmol/L (ref 135–145)

## 2022-11-20 LAB — CBC
HCT: 46.3 % (ref 39.0–52.0)
Hemoglobin: 15.2 g/dL (ref 13.0–17.0)
MCH: 27.4 pg (ref 26.0–34.0)
MCHC: 32.8 g/dL (ref 30.0–36.0)
MCV: 83.6 fL (ref 80.0–100.0)
Platelets: 204 10*3/uL (ref 150–400)
RBC: 5.54 MIL/uL (ref 4.22–5.81)
RDW: 13.2 % (ref 11.5–15.5)
WBC: 8 10*3/uL (ref 4.0–10.5)
nRBC: 0 % (ref 0.0–0.2)

## 2022-11-20 LAB — HIV ANTIBODY (ROUTINE TESTING W REFLEX): HIV Screen 4th Generation wRfx: NONREACTIVE

## 2022-11-20 MED ORDER — PREDNISONE 20 MG PO TABS
50.0000 mg | ORAL_TABLET | Freq: Four times a day (QID) | ORAL | Status: AC
  Administered 2022-11-20 (×3): 50 mg via ORAL
  Filled 2022-11-20 (×4): qty 3

## 2022-11-20 MED ORDER — PANTOPRAZOLE SODIUM 40 MG PO TBEC
40.0000 mg | DELAYED_RELEASE_TABLET | Freq: Every day | ORAL | Status: DC
  Administered 2022-11-20 – 2022-11-21 (×2): 40 mg via ORAL
  Filled 2022-11-20 (×2): qty 1

## 2022-11-20 MED ORDER — DIPHENHYDRAMINE HCL 50 MG/ML IJ SOLN
50.0000 mg | Freq: Once | INTRAMUSCULAR | Status: AC
  Filled 2022-11-20: qty 1

## 2022-11-20 MED ORDER — DIPHENHYDRAMINE HCL 25 MG PO CAPS
50.0000 mg | ORAL_CAPSULE | Freq: Once | ORAL | Status: AC
  Administered 2022-11-20: 50 mg via ORAL
  Filled 2022-11-20: qty 2

## 2022-11-20 MED ORDER — IOHEXOL 300 MG/ML  SOLN
100.0000 mL | Freq: Once | INTRAMUSCULAR | Status: AC | PRN
  Administered 2022-11-20: 100 mL via INTRAVENOUS

## 2022-11-20 MED ORDER — AMOXICILLIN-POT CLAVULANATE 875-125 MG PO TABS
1.0000 | ORAL_TABLET | Freq: Two times a day (BID) | ORAL | Status: DC
  Administered 2022-11-20 (×2): 1 via ORAL
  Filled 2022-11-20 (×3): qty 1

## 2022-11-20 NOTE — Progress Notes (Signed)
Progress Note  RN called due to patient complaining of left lower quadrant pain which was described as sharp and hurts to breathe. At bedside, pain was rated as 7/10 on pain scale.  He states that he has history of diverticulitis and that the pain was similar to when he had diverticulitis. Left lower quadrant was tender to palpation, but it was without guarding. CT abdomen and pelvis with contrast was suggested to patient.  Patient states that he was allergic to contrast dye.  Contrast allergy order set with prednisone and Benadryl prior to the CT scan was explained to patient and he agreed with the plan. CT abdomen and pelvis with contrast pending.  Total time:  29 minutes This includes time reviewing the chart including progress notes, labs, EKGs, taking medical decisions, ordering labs and documenting findings.

## 2022-11-20 NOTE — Progress Notes (Signed)
   11/20/22 0837  TOC Brief Assessment  Insurance and Status Reviewed  Patient has primary care physician Yes  Home environment has been reviewed Lives with mother per chart.  Prior level of function: Independent.  Prior/Current Home Services No current home services  Social Determinants of Health Reivew SDOH reviewed no interventions necessary  Readmission risk has been reviewed Yes  Transition of care needs no transition of care needs at this time

## 2022-11-20 NOTE — Progress Notes (Signed)
Progress Note   Patient: Anthony Sharp LKG:[id number] DOB: December 28, 1990 DOA: 11/18/2022     1 DOS: the patient was seen and examined on 11/20/2022   Brief history of present illness: XAN JASSO is a 32 y.o. male with medical history significant of class II obesity, hypertension, anxiety, tobacco abuse and history of hereditary hemochromatosis; who presented to the hospital secondary to medication reaction.  Patient reports experiencing low-grade temperature, agitation, tachypnea, tachycardia, diaphoresis and tremors after taking 2 dosages of prescribed Effexor as part of treatment for his underlying anxiety condition. Patient expressed some chest tightness, but no frank pain.  There has not been any sick contacts, associated nausea or vomiting, dysuria, hematuria, focal weaknesses, hematochezia or melena.   In the ED patient received Valium, fluid resuscitation and supportive care.  Blood work demonstrating mild hypokalemia and a stable CK levels.  Chest x-ray with no acute cardiopulmonary process.  Patient was still slightly tachycardic, agitated and not feeling well; TRH contacted to place patient in the hospital for further evaluation and management of mild to moderate serotonin syndrome.  Assessment and Plan: * Serotonin syndrome -Patient recently started on Effexor; 37.5 mg twice a day; Mild to moderate serotonin syndrome developed after first date of usage. -Reports tremors, low-grade temperature, agitation, tachycardia, tachypnea and diaphoresis -Symptoms has already started to improve after receiving Valium and fluid resuscitation -Still slightly agitated and is present muscle spasms. -CK level without significant elevation -Continue adequate hydration; offending agent has been discontinued -As needed benzodiazepine and Robaxin will be provided -Follow clinical response.  Hypokalemia -Replete electrolytes and follow trend -Will check magnesium level.  GAD (generalized anxiety  disorder) -Continue as needed benzodiazepines -Outpatient follow-up with PCP and if needed psychiatry service for further management. -At time of evaluation patient mood was stable.  Obesity -Body mass index is 35.13 kg/m. -Low-calorie diet, portion control and increase physical activity discussed with patient.  Smoker -Smoking cessation counseling provided -Decline nicotine patch at the moment.  Hypertension -Continue the use of Norvasc. -Heart healthy diet and tobacco cessation discussed with patient -Follow vital signs.  Left lower quadrant abdominal pain -Patient with prior history of diverticulitis -In the absence of nausea and vomiting we will start treatment with Augmentin orally -Follow CT scan results. -Maintain adequate hydration and provide as needed analgesics and supportive care.  Subjective:  Afebrile, no chest pain, no nausea, no vomiting.  Complaining of left lower quadrant pain and expressing feeling anxious.  Physical Exam: Vitals:   11/19/22 2026 11/20/22 0529 11/20/22 0943 11/20/22 1305  BP: (!) 136/101 119/74 124/80 125/84  Pulse: 75 70 76 (!) 103  Resp: 20 16  20   Temp: 98.7 F (37.1 C) 98.7 F (37.1 C) 98.1 F (36.7 C) 98.7 F (37.1 C)  TempSrc: Oral  Oral Oral  SpO2: 98% 96% 96% 98%  Weight:      Height:       General exam: Alert, awake, oriented x 3; no chest pain, reports no nausea or vomiting.  Complaining of left lower quadrant pain.  Denies palpitations.  Slightly anxious. Respiratory system: Clear to auscultation. Respiratory effort normal.  Good saturation on room air. Cardiovascular system:RRR. No rubs or gallops; no JVD. Gastrointestinal system: Abdomen is nondistended, soft and tender to palpation in his left lower quadrant; no guarding.  Positive bowel sounds. Central nervous system: No focal neurological deficits. Extremities: No cyanosis, clubbing or edema. Skin: No petechiae. Psychiatry: Judgement and insight appear normal. Mood  & affect appropriate.   Data  Reviewed: Basic metabolic panel: Sodium 136, potassium 3.7, chloride 107, bicarb 23, glucose 95, BUN 13, creatinine 1.0 and GFR > 60 CBC: White blood cells 8.0, hemoglobin 15.2 platelet count 204K TSH:2.930 Magnesium: 2.2  Family Communication: Family at bedside.  Disposition: Status is: Inpatient Remains inpatient appropriate because: Continue as needed antiemetics, analgesics and supportive care.  Patient with concerns of new development of acute diverticulitis.  Initial admission for serotonin syndrome now resolved.   Planned Discharge Destination: Home   Time spent: 50 minutes  Author: Vassie Loll, MD 11/20/2022 2:42 PM  For on call review [email or web address].

## 2022-11-20 NOTE — Progress Notes (Signed)
Patient c/o lower L sided abdominal pain. Sharp, hurts to breathe. Tender to touch. Patient stated he has a hx of diverticulitis and was questioning if this could be a possible flare up? MD Thomes Dinning made aware and went to see patient at the bedside.

## 2022-11-21 DIAGNOSIS — F17200 Nicotine dependence, unspecified, uncomplicated: Secondary | ICD-10-CM | POA: Diagnosis not present

## 2022-11-21 DIAGNOSIS — R0789 Other chest pain: Secondary | ICD-10-CM | POA: Diagnosis not present

## 2022-11-21 DIAGNOSIS — K6389 Other specified diseases of intestine: Secondary | ICD-10-CM

## 2022-11-21 DIAGNOSIS — G2579 Other drug induced movement disorders: Secondary | ICD-10-CM | POA: Diagnosis not present

## 2022-11-21 DIAGNOSIS — K219 Gastro-esophageal reflux disease without esophagitis: Secondary | ICD-10-CM

## 2022-11-21 MED ORDER — KETOROLAC TROMETHAMINE 30 MG/ML IJ SOLN
30.0000 mg | Freq: Once | INTRAMUSCULAR | Status: AC
  Administered 2022-11-21: 30 mg via INTRAVENOUS
  Filled 2022-11-21: qty 1

## 2022-11-21 MED ORDER — IBUPROFEN 800 MG PO TABS: 800.0000 mg | ORAL_TABLET | Freq: Three times a day (TID) | ORAL | 0 refills | Status: AC | PRN

## 2022-11-21 MED ORDER — PANTOPRAZOLE SODIUM 40 MG PO TBEC: 40.0000 mg | DELAYED_RELEASE_TABLET | Freq: Every day | ORAL | 1 refills | Status: AC

## 2022-11-21 MED ORDER — LORAZEPAM 1 MG PO TABS
1.0000 mg | ORAL_TABLET | Freq: Three times a day (TID) | ORAL | 0 refills | Status: AC | PRN
Start: 2022-11-21 — End: ?

## 2022-11-21 NOTE — Assessment & Plan Note (Signed)
-  Likely associated with gastroesophageal reflux disease -Patient have been discharged on PPI. -EKG and troponin not demonstrating acute ischemic changes.

## 2022-11-21 NOTE — Plan of Care (Signed)
  Problem: Education: Goal: Knowledge of General Education information will improve Description: Including pain rating scale, medication(s)/side effects and non-pharmacologic comfort measures Outcome: Progressing   Problem: Activity: Goal: Risk for activity intolerance will decrease Outcome: Progressing   Problem: Nutrition: Goal: Adequate nutrition will be maintained Outcome: Progressing   Problem: Coping: Goal: Level of anxiety will decrease Outcome: Progressing   

## 2022-11-21 NOTE — Assessment & Plan Note (Signed)
-  Patient has been discharged on Protonix daily. -Lifestyle changes and weight loss recommended.

## 2022-11-21 NOTE — Discharge Summary (Signed)
Physician Discharge Summary   Patient: Anthony Sharp MRN: [id number] DOB: 1990-04-08  Admit date:     11/18/2022  Discharge date: 11/21/22  Discharge Physician: Vassie Loll   PCP: Ponciano Ort, [facility]   Recommendations at discharge:  Reassess blood pressure and adjust antihypertensive treatment as needed Continue assisting patient with a smoking cessation Patient has been discharged on as needed Ativan and will need definitive treatment for his underlying anxiety condition.  Discharge Diagnoses: Principal Problem:   Serotonin syndrome Active Problems:   Hypertension   Smoker   Obesity   GAD (generalized anxiety disorder)   Hypokalemia   Epiploic appendagitis   Atypical chest pain   Gastroesophageal reflux disease  Brief history of present illness: Anthony Sharp is a 32 y.o. male with medical history significant of class II obesity, hypertension, anxiety, tobacco abuse and history of hereditary hemochromatosis; who presented to the hospital secondary to medication reaction.  Patient reports experiencing low-grade temperature, agitation, tachypnea, tachycardia, diaphoresis and tremors after taking 2 dosages of prescribed Effexor as part of treatment for his underlying anxiety condition. Patient expressed some chest tightness, but no frank pain.  There has not been any sick contacts, associated nausea or vomiting, dysuria, hematuria, focal weaknesses, hematochezia or melena.   In the ED patient received Valium, fluid resuscitation and supportive care.  Blood work demonstrating mild hypokalemia and a stable CK levels.  Chest x-ray with no acute cardiopulmonary process.  Patient was still slightly tachycardic, agitated and not feeling well; TRH contacted to place patient in the hospital for further evaluation and management of mild to moderate serotonin syndrome.    Assessment and Plan: * Serotonin syndrome -Patient recently started on Effexor; 37.5 mg twice a day; Mild to  moderate serotonin syndrome developed after first date of usage. -At home patient experienced tremors, low-grade temperature, agitation, tachycardia, tachypnea and diaphoresis -Symptoms completely resolved at time of discharge -CK level within normal limits -Offending medication has been discontinued and patient instructed to follow-up with PCP for further definitive treatment of his underlying anxiety disorder. -Patient advised to maintain adequate hydration and safe for discharge.  Gastroesophageal reflux disease -Patient has been discharged on Protonix daily. -Lifestyle changes and weight loss recommended.  Atypical chest pain -Likely associated with gastroesophageal reflux disease -Patient have been discharged on PPI. -EKG and troponin not demonstrating acute ischemic changes.  Epiploic appendagitis -Left lower quadrant pain secondary to epiploic appendagitis -No fever, normal WBCs and no significant stranding or abscess formation on CT scan -Case discussed with general surgery who recommended treatment with adequate hydration and the use of NSAIDs -No antibiotics needed.  Hypokalemia -Repleted and within normal limits currently -Magnesium within normal limits -Repeat basic metabolic panel to follow electrolytes trend/stability. -Patient advised to maintain adequate hydration.  GAD (generalized anxiety disorder) -Continue as needed benzodiazepines -Outpatient follow-up with PCP and if needed psychiatry service for further management. -At time of discharge patient's mood was stable.  Obesity -Body mass index is 35.13 kg/m. -Low-calorie diet, portion control and increase physical activity discussed with patient.  Smoker -I have discussed tobacco cessation with the patient.  I have counseled the patient regarding the negative impacts of continued tobacco use including but not limited to lung cancer, COPD, and cardiovascular disease.  I have discussed alternatives to tobacco  and modalities that may help facilitate tobacco cessation including but not limited to biofeedback, hypnosis, and medications.  Total time spent with tobacco counseling was 8 minutes. -Patient has decline nicotine patch at the  moment.  Hypertension -Continue the use of Norvasc. -Heart healthy diet and tobacco cessation discussed with patient -Continue outpatient follow-up with PCP to further adjust antihypertensive treatment as needed.   Consultants: Dr. Lovell Sheehan general surgery (curbside). Procedures performed: See below for x-ray reports. Disposition: Home Diet recommendation: Heart healthy diet.  DISCHARGE MEDICATION: Allergies as of 11/21/2022       Reactions   Iohexol Hives, Itching   Broke out in hives and itching after contrast injection on 07/25/2022   Bee Venom Swelling   Hydrocodone Itching   Chlorthalidone Other (See Comments)   Tinnitus and insomnia        Medication List     STOP taking these medications    diphenhydrAMINE 50 MG tablet Commonly known as: BENADRYL   venlafaxine 37.5 MG tablet Commonly known as: EFFEXOR       TAKE these medications    amLODipine 10 MG tablet Commonly known as: NORVASC Take 1 tablet (10 mg total) by mouth daily.   ibuprofen 800 MG tablet Commonly known as: ADVIL Take 1 tablet (800 mg total) by mouth every 8 (eight) hours as needed for moderate pain. What changed:  medication strength how much to take when to take this reasons to take this   LORazepam 1 MG tablet Commonly known as: Ativan Take 1 tablet (1 mg total) by mouth every 8 (eight) hours as needed for anxiety. What changed: when to take this   pantoprazole 40 MG tablet Commonly known as: PROTONIX Take 1 tablet (40 mg total) by mouth daily. Start taking on: November 22, 2022        Follow-up Information     [facility]. Schedule an appointment as soon as possible for a visit in 1 week(s).   Contact information: [address] [place] [address] [phone]                Discharge Exam: Filed Weights   11/18/22 1945 11/19/22 0844  Weight: 117.9 kg 117.5 kg   General exam: Alert, awake, oriented x 3; no fever, no nausea, no vomiting, no chest pain.  Feeling ready to go home. Respiratory system: Clear to auscultation. Respiratory effort normal.  Good saturation on room air. Cardiovascular system:RRR. No rubs or gallops; no JVD. Gastrointestinal system: Abdomen is nondistended, soft and just demonstrating mild discomfort on deep palpation to his left lower quadrant.  Positive bowel sounds. Central nervous system: Alert and oriented. No focal neurological deficits. Extremities: No C/C/E, +pedal pulses Skin: No rashes, lesions or ulcers Psychiatry: Judgement and insight appear normal. Mood & affect appropriate.    Condition at discharge: stable  The results of significant diagnostics from this hospitalization (including imaging, microbiology, ancillary and laboratory) are listed below for reference.   Imaging Studies: CT ABDOMEN PELVIS W CONTRAST  Result Date: 11/20/2022 CLINICAL DATA:  Left lower quadrant abdominal pain. EXAM: CT ABDOMEN AND PELVIS WITH CONTRAST TECHNIQUE: Multidetector CT imaging of the abdomen and pelvis was performed using the standard protocol following bolus administration of intravenous contrast. RADIATION DOSE REDUCTION: This exam was performed according to the departmental dose-optimization program which includes automated exposure control, adjustment of the mA and/or kV according to patient size and/or use of iterative reconstruction technique. CONTRAST:  OMNIPAQUE IOHEXOL 300 MG/ML SOLN. Patient received 13 hour prep prior to IV contrast administration. COMPARISON:  None Available. FINDINGS: Lower chest: No acute abnormality. Hepatobiliary: Unremarkable liver. Normal gallbladder. No biliary dilation. Pancreas: Unremarkable. Spleen: Unremarkable. Adrenals/Urinary Tract:  Normal adrenal  glands. No urinary calculi or hydronephrosis. Bladder is unremarkable. Stomach/Bowel: Normal caliber large and small bowel. No bowel wall thickening. The appendix is normal.Stomach is within normal limits. Vascular/Lymphatic: No significant vascular findings are present. No enlarged abdominal or pelvic lymph nodes. Reproductive: Unremarkable. Other: Focal fat stranding anterior to the descending colon (series 2/image 60) compatible with epiploic appendagitis. No free intraperitoneal fluid or air. Musculoskeletal: No acute fracture. IMPRESSION: Epiploic appendagitis of the distal descending colon. Electronically Signed   By: Minerva Fester M.D.   On: 11/20/2022 21:43   DG Chest Port 1 View  Result Date: 11/19/2022 CLINICAL DATA:  Chest pain EXAM: PORTABLE CHEST 1 VIEW COMPARISON:  11/14/2022 FINDINGS: The heart size and mediastinal contours are within normal limits. Both lungs are clear. The visualized skeletal structures are unremarkable. IMPRESSION: Normal study. Electronically Signed   By: Charlett Nose M.D.   On: 11/19/2022 00:06   DG Chest 2 View  Result Date: 11/14/2022 CLINICAL DATA:  Chest pain EXAM: CHEST - 2 VIEW COMPARISON:  CXR 06/19/22. FINDINGS: The heart size and mediastinal contours are within normal limits. No consolidation, pneumothorax or effusion. No edema. The visualized skeletal structures are unremarkable. IMPRESSION: No acute cardiopulmonary disease. Electronically Signed   By: Karen Kays M.D.   On: 11/14/2022 21:26    Microbiology: Results for orders placed or performed in visit on 12/10/19  Novel Coronavirus, NAA ([facility])     Status: None   Collection Time: 12/10/19  1:06 PM   Specimen: Nasopharyngeal(NP) swabs in vial transport medium   Nasopharynge  Screenin  Result Value Ref Range Status   SARS-CoV-2, NAA Not Detected Not Detected Final    Comment: This nucleic acid amplification test was developed and its performance characteristics determined by [employer]. Nucleic acid amplification tests include RT-PCR and TMA. This test has not been FDA cleared or approved. This test has been authorized by FDA under an Emergency Use Authorization (EUA). This test is only authorized for the duration of time the declaration that circumstances exist justifying the authorization of the emergency use of in vitro diagnostic tests for detection of SARS-CoV-2 virus and/or diagnosis of COVID-19 infection under section 564(b)(1) of the Act, 21 U.S.C. 956OZH-0(Q) (1), unless the authorization is terminated or revoked sooner. When diagnostic testing is negative, the possibility of a false negative result should be considered in the context of a patient's recent exposures and the presence of clinical signs and symptoms consistent with COVID-19. An individual without symptoms of COVID-19 and who is not shedding SARS-CoV-2 virus wo uld expect to have a negative (not detected) result in this assay.   SARS-COV-2, NAA 2 DAY TAT     Status: None   Collection Time: 12/10/19  1:06 PM   Nasopharynge  Screenin  Result Value Ref Range Status   SARS-CoV-2, NAA 2 DAY TAT Performed  Final    Labs: CBC: Recent Labs  Lab 11/14/22 2107 11/18/22 2315 11/20/22 0402  WBC 9.5 8.5 8.0  NEUTROABS  --  4.9  --   HGB 15.9 15.8 15.2  HCT 48.2 47.5 46.3  MCV 83.0 81.8 83.6  PLT 238 228 204   Basic Metabolic Panel: Recent Labs  Lab 11/14/22 2107 11/18/22 2315 11/19/22 0133 11/20/22 0402  NA 136 136  --  136  K 3.2* 3.3*  --  3.7  CL 100 102  --  107  CO2 24 25  --  23  GLUCOSE 113* 103*  --  95  BUN 18 13  --  13  CREATININE 1.28* 1.08  --  1.00  CALCIUM 9.0 8.7*  --  8.5*  MG  --   --  2.2  --    Liver Function Tests: Recent Labs  Lab 11/18/22 2315  AST 24  ALT 42  ALKPHOS 57  BILITOT 0.7  PROT 7.1  ALBUMIN 4.1   CBG: No results for input(s): "GLUCAP" in the last 168 hours.  Discharge time spent: greater than 30 minutes.  Signed: Vassie Loll, MD [facility] 11/21/2022

## 2022-11-21 NOTE — Assessment & Plan Note (Signed)
-  Left lower quadrant pain secondary to epiploic appendagitis -No fever, normal WBCs and no significant stranding or abscess formation on CT scan -Case discussed with general surgery who recommended treatment with adequate hydration and the use of NSAIDs -No antibiotics needed.

## 2023-02-18 ENCOUNTER — Other Ambulatory Visit: Payer: Self-pay

## 2023-02-18 ENCOUNTER — Encounter (HOSPITAL_COMMUNITY): Payer: Self-pay

## 2023-02-18 ENCOUNTER — Emergency Department (HOSPITAL_COMMUNITY)
Admission: EM | Admit: 2023-02-18 | Discharge: 2023-02-18 | Disposition: A | Discharge status: Home / Self Care | Payer: Medicaid Other | Attending: Emergency Medicine | Admitting: Emergency Medicine

## 2023-02-18 DIAGNOSIS — F419 Anxiety disorder, unspecified: Secondary | ICD-10-CM | POA: Insufficient documentation

## 2023-02-18 NOTE — ED Provider Notes (Signed)
[place] EMERGENCY DEPARTMENT AT [facility] Provider Note   CSN: [id number] Arrival date & time: 02/18/23  1716     History  Chief Complaint  Patient presents with   Anxiety    Anthony Sharp is a 32 y.o. male.  With a history of anxiety presenting for anxiety.  Patient has been taking 0.25 mg alprazolam twice daily for the last month for anxiety.  He recently started on Lexapro yesterday morning for generalized anxiety.  He did not want to continue taking both medications so he has not been taking his alprazolam.  He presents for acute anxiety.  No chest pain shortness of breath, SI, HI or other drug use   Anxiety       Home Medications Prior to Admission medications   Medication Sig Start Date End Date Taking? Authorizing Provider  amLODipine (NORVASC) 10 MG tablet Take 1 tablet (10 mg total) by mouth daily. 08/23/21   McGowen, Maryjean Morn, MD  ibuprofen (ADVIL) 800 MG tablet Take 1 tablet (800 mg total) by mouth every 8 (eight) hours as needed for moderate pain. 11/21/22   Vassie Loll, MD  LORazepam (ATIVAN) 1 MG tablet Take 1 tablet (1 mg total) by mouth every 8 (eight) hours as needed for anxiety. 11/21/22   Vassie Loll, MD  pantoprazole (PROTONIX) 40 MG tablet Take 1 tablet (40 mg total) by mouth daily. 11/22/22   Vassie Loll, MD      Allergies    Iohexol, Bee venom, Hydrocodone, and Chlorthalidone    Review of Systems   Review of Systems  Physical Exam Updated Vital Signs BP (!) 143/102 (BP Location: Right Arm)   Pulse 86   Temp 98.9 F (37.2 C)   Resp 16   Ht 6' (1.829 m)   Wt 117.6 kg   SpO2 97%   BMI 35.16 kg/m  Physical Exam Vitals and nursing note reviewed.  HENT:     Head: Normocephalic and atraumatic.  Eyes:     Pupils: Pupils are equal, round, and reactive to light.  Cardiovascular:     Rate and Rhythm: Normal rate and regular rhythm.  Pulmonary:     Effort: Pulmonary effort is normal.     Breath sounds: Normal breath sounds.   Abdominal:     Palpations: Abdomen is soft.     Tenderness: There is no abdominal tenderness.  Skin:    General: Skin is warm and dry.  Neurological:     Mental Status: He is alert.  Psychiatric:        Mood and Affect: Mood normal.     ED Results / Procedures / Treatments   Labs (all labs ordered are listed, but only abnormal results are displayed) Labs Reviewed - No data to display  EKG None  Radiology No results found.  Procedures Procedures    Medications Ordered in ED Medications - No data to display  ED Course/ Medical Decision Making/ A&P                                 Medical Decision Making 32 year old male with history of anxiety presenting for acute anxiety.  Discontinued alprazolam after starting Lexapro yesterday.  I counseled him on likely withdrawal symptoms.  I instructed him to continue taking alprazolam as he previously had (0.25 mg twice daily) as the Lexapro can take weeks to a month before it starts taking effect for his anxiety.  He has both of these medications at home.  No HI SI.  He will follow-up with his prescriber.           Final Clinical Impression(s) / ED Diagnoses Final diagnoses:  Anxiety    Rx / DC Orders ED Discharge Orders     None         Aaronmichael, Creque, DO 02/18/23 2010

## 2023-02-18 NOTE — Discharge Instructions (Signed)
You were seen in the emerged part for anxiety As discussed, you should continue taking your alprazolam as you previously were twice daily until the Lexapro takes effect.  The Lexapro can take weeks to a month to start working.  Gradually cut your alprazolam down after the Lexapro has had time to work Follow-up with your prescriber Return to the emerged part for severe anxiety or thoughts of harming self or others

## 2023-02-18 NOTE — ED Triage Notes (Signed)
Pt has been taking xanax twice a day for 1 month and stopped taking it the other day when he ws prescribed lexapro.  Pt reports having hot flashes and feeling jittery.  Pt reports he wasn't sure if it was from stopping the xanax or starting the lexapro.  Pt started the xanax back but it still having some symptoms.

## 2024-04-25 ENCOUNTER — Other Ambulatory Visit: Payer: Self-pay | Admitting: Orthopedic Surgery

## 2024-04-25 ENCOUNTER — Ambulatory Visit: Admission: RE | Admit: 2024-04-25 | Source: Ambulatory Visit

## 2024-04-25 DIAGNOSIS — M79641 Pain in right hand: Secondary | ICD-10-CM
